# Patient Record
Sex: Male | Born: 1970 | Hispanic: No | Marital: Married | Smoking: Never smoker
Health system: Southern US, Community
[De-identification: ages and names within clinical notes are randomized; demographics above are authoritative.]

## PROBLEM LIST (undated history)

## (undated) DIAGNOSIS — I10 Essential (primary) hypertension: Secondary | ICD-10-CM

## (undated) DIAGNOSIS — F419 Anxiety disorder, unspecified: Secondary | ICD-10-CM

## (undated) DIAGNOSIS — M109 Gout, unspecified: Secondary | ICD-10-CM

## (undated) HISTORY — PX: ANTERIOR CRUCIATE LIGAMENT REPAIR: SHX115

## (undated) HISTORY — PX: APPENDECTOMY: SHX54

---

## 2013-11-01 ENCOUNTER — Encounter: Payer: Self-pay | Admitting: *Deleted

## 2013-11-01 ENCOUNTER — Ambulatory Visit (INDEPENDENT_AMBULATORY_CARE_PROVIDER_SITE_OTHER): Payer: PRIVATE HEALTH INSURANCE | Admitting: Cardiology

## 2013-11-01 ENCOUNTER — Encounter: Payer: Self-pay | Admitting: Cardiology

## 2013-11-01 VITALS — BP 125/85 | HR 63 | Ht 66.0 in | Wt 251.0 lb

## 2013-11-01 DIAGNOSIS — I10 Essential (primary) hypertension: Secondary | ICD-10-CM | POA: Insufficient documentation

## 2013-11-01 DIAGNOSIS — M109 Gout, unspecified: Secondary | ICD-10-CM

## 2013-11-01 NOTE — Progress Notes (Signed)
      [address] [place], [place]  [address] Phone: [phone] Fax:  [phone]  Date:  11/01/2013   ID:  Matthew LaityRicardo Blocher, DOB 02-16-1971, MRN [id number]  PCP:  No primary provider on file.   History of Present Illness: Matthew LaityRicardo Enis is a 43 y.o. male here for the evaluation of hypertension. Recently moved from [place]. Was on medication for HTN in the past in [place]. Wife wanted him to be seen. Stopped medication a few months ago. On HCTZ in the past. Wedding ring tight periodically. Travels with his company quite frequently. [place] is new to [place]. He has a friend here from [place].  No chest pain. No SOB. No prior strokelike symptoms.   Wt Readings from Last 3 Encounters:  11/01/13 251 lb (113.853 kg)     No past medical history on file.  Past Surgical History  Procedure Laterality Date  . Appendectomy    . Anterior cruciate ligament repair      No current outpatient prescriptions on file.   No current facility-administered medications for this visit.    Allergies:   No Known Allergies  Social History:  The patient  reports that he has never smoked. He does not have any smokeless tobacco history on file.   Family History  Problem Relation Age of Onset  . Cancer Father   . Heart attack Maternal Grandfather   . Hyperlipidemia    . Hyperlipidemia Mother     ROS:  Please see the history of present illness.   Denies any fevers, chills, orthopnea, PND, strokelike, chest pain   All other systems reviewed and negative.   PHYSICAL EXAM: VS:  BP 125/85  Pulse 63  Ht 5\' 6"  (1.676 m)  Wt 251 lb (113.853 kg)  BMI 40.53 kg/m2 Well nourished, well developed, in no acute distress HEENT: normal, [place]/AT, EOMI thick neck Neck: no JVD, normal carotid upstroke, no bruit Cardiac:  normal S1, S2; RRR; no murmur Lungs:  clear to auscultation bilaterally, no wheezing, rhonchi or rales Abd: soft, nontender, no hepatomegaly, no bruitsobese Ext: no edema, 2+ distal  pulses Skin: warm and dry GU: deferred Neuro: no focal abnormalities noted, AAO x 3  EKG:  11/01/13-sinus rhythm, 63, no other abnormalities.     ASSESSMENT AND PLAN:  1. HTN - was on diovan HCT 320/12.5, amlodipine 5mg  in [place]. Currently, his blood pressure is within normal range. I've asked him to periodically check this at the pharmacy. At one point, he had a home blood pressure cuff and was getting anxious with this and ended up having to get rid of it. 2. Gout-has allopurinol. He may wish to avoid HCTZ. 3. Obesity-continue to encourage weight loss. Exercise. 4. I offered him a repeat followup however he would like to see me back on as-needed basis. Encouraged him to monitor his blood pressure. Instruct him that this could be a risk for stroke or heart attack if uncontrolled. I would've expected his blood pressure to be higher with the amount of medication that he was previously on.  Signed, Donato SchultzMark Skains, MD [facility]  11/01/2013 3:09 PM

## 2013-11-01 NOTE — Patient Instructions (Signed)
Check Blood Pressure periodically at local drug store if blood pressure greater than 140/90 call and let Dr.Skains know   Need a PCP    Follow up with Dr.Skains as needed

## 2014-10-11 ENCOUNTER — Other Ambulatory Visit (INDEPENDENT_AMBULATORY_CARE_PROVIDER_SITE_OTHER): Payer: Self-pay | Admitting: General Surgery

## 2014-10-11 ENCOUNTER — Other Ambulatory Visit: Payer: Self-pay | Admitting: *Deleted

## 2014-10-11 DIAGNOSIS — R109 Unspecified abdominal pain: Secondary | ICD-10-CM

## 2014-10-11 NOTE — Addendum Note (Signed)
Addended by: Ernestene MentionINGRAM, Loren Sawaya M on: 10/11/2014 10:25 AM   Modules accepted: Orders

## 2014-10-22 ENCOUNTER — Ambulatory Visit
Admission: RE | Admit: 2014-10-22 | Discharge: 2014-10-22 | Disposition: A | Discharge status: Home / Self Care | Payer: PRIVATE HEALTH INSURANCE | Source: Ambulatory Visit | Attending: General Surgery | Admitting: General Surgery

## 2018-12-30 ENCOUNTER — Emergency Department (INDEPENDENT_AMBULATORY_CARE_PROVIDER_SITE_OTHER)
Admission: EM | Admit: 2018-12-30 | Discharge: 2018-12-30 | Disposition: A | Discharge status: Home / Self Care | Payer: PRIVATE HEALTH INSURANCE | Source: Home / Self Care

## 2018-12-30 ENCOUNTER — Other Ambulatory Visit: Payer: Self-pay

## 2018-12-30 DIAGNOSIS — M7989 Other specified soft tissue disorders: Secondary | ICD-10-CM | POA: Diagnosis not present

## 2018-12-30 DIAGNOSIS — M79671 Pain in right foot: Secondary | ICD-10-CM | POA: Diagnosis not present

## 2018-12-30 LAB — POCT CBC W AUTO DIFF (K'VILLE URGENT CARE)

## 2018-12-30 MED ORDER — COLCHICINE 0.6 MG PO TABS: ORAL_TABLET | ORAL | 0 refills | Status: DC

## 2018-12-30 MED ORDER — PREDNISONE 50 MG PO TABS: 50.0000 mg | ORAL_TABLET | Freq: Every day | ORAL | 0 refills | Status: AC

## 2018-12-30 NOTE — ED Provider Notes (Signed)
[facility]    CSN: [id number] Arrival date & time: 12/30/18  0950     History   Chief Complaint Chief Complaint  Patient presents with  . Foot Pain    HPI Matthew George is a 48 y.o. male.   HPI Matthew George is a 48 y.o. male presenting to UC with c/o 3 days of gradually worsening Right foot pain, swelling, and redness.  He has taken ibuprofen with mild improvement in pain.  Hx of similar symptoms last year, per medical records, pt was dx with tendonitis.  He believes he has been tested for gout but has never been dx with gout.  Denies hx of cellulitis. Denies fever, chills, n/v/d. No recent known injury.    History reviewed. No pertinent past medical history.  Patient Active Problem List   Diagnosis Date Noted  . Essential hypertension 11/01/2013  . Morbid obesity ([place]) 11/01/2013  . Gout 11/01/2013    Past Surgical History:  Procedure Laterality Date  . ANTERIOR CRUCIATE LIGAMENT REPAIR    . APPENDECTOMY         Home Medications    Prior to Admission medications   Medication Sig Start Date End Date Taking? Authorizing Provider  FLUoxetine (PROZAC) 10 MG tablet Take by mouth. 10/19/18 10/19/19 Yes [provider]  colchicine 0.6 MG tablet Take 2 tabs (1.2mg ) followed by 1 tab one hour later. May take 1 tab the next 2 days. 12/30/18   Noe Gens, PA-C  predniSONE (DELTASONE) 50 MG tablet Take 1 tablet (50 mg total) by mouth daily with breakfast for 5 days. 12/30/18 01/04/19  Noe Gens, PA-C    Family History Family History  Problem Relation Age of Onset  . Hyperlipidemia Mother   . Cancer Father   . Heart attack Maternal Grandfather   . Hyperlipidemia Other     Social History Social History   Tobacco Use  . Smoking status: Never Smoker  Substance Use Topics  . Alcohol use: Not on file  . Drug use: Not on file     Allergies   Iodine   Review of Systems Review of Systems  Constitutional: Negative for chills and  fever.  Musculoskeletal: Positive for arthralgias and joint swelling.  Skin: Positive for color change. Negative for wound.     Physical Exam Triage Vital Signs ED Triage Vitals  Enc Vitals Group     BP 12/30/18 1009 123/83     Pulse Rate 12/30/18 1009 85     Resp 12/30/18 1009 20     Temp 12/30/18 1009 98.5 F (36.9 C)     Temp Source 12/30/18 1009 Oral     SpO2 12/30/18 1009 95 %     Weight 12/30/18 1010 244 lb (110.7 kg)     Height 12/30/18 1010 5\' 8"  (1.727 m)     Head Circumference --      Peak Flow --      Pain Score 12/30/18 1010 7     Pain Loc --      Pain Edu? --      Excl. in [place]? --    No data found.  Updated Vital Signs BP 123/83 (BP Location: Right Arm)   Pulse 85   Temp 98.5 F (36.9 C) (Oral)   Resp 20   Ht 5\' 8"  (1.727 m)   Wt 244 lb (110.7 kg)   SpO2 95%   BMI 37.10 kg/m   Visual Acuity Right Eye Distance:   Left Eye  Distance:   Bilateral Distance:    Right Eye Near:   Left Eye Near:    Bilateral Near:     Physical Exam Vitals signs and nursing note reviewed.  Constitutional:      Appearance: Normal appearance. He is well-developed.  HENT:     Head: Normocephalic and atraumatic.  Neck:     Musculoskeletal: Normal range of motion.  Cardiovascular:     Rate and Rhythm: Normal rate.  Pulmonary:     Effort: Pulmonary effort is normal.  Musculoskeletal: Normal range of motion.        General: Swelling and tenderness present.     Comments: Right foot: mild to moderate edema compared to Left. Tenderness to dorsal aspect. Full ROM ankle and toes. No tenderness of ankle or toes.  Skin:    General: Skin is warm and dry.     Capillary Refill: Capillary refill takes less than 2 seconds.     Findings: Erythema present.          Comments: Right foot: diffuse erythema and warmth of skin. No induration or fluctuance. Mild tenderness.   Neurological:     Mental Status: He is alert and oriented to person, place, and time.  Psychiatric:         Behavior: Behavior normal.      UC Treatments / Results  Labs (all labs ordered are listed, but only abnormal results are displayed) Labs Reviewed  URIC ACID  POCT CBC W AUTO DIFF ([facility])    EKG   Radiology No results found.  Procedures Procedures (including critical care time)  Medications Ordered in UC Medications - No data to display  Initial Impression / Assessment and Plan / UC Course  I have reviewed the triage vital signs and the nursing notes.  Pertinent labs & imaging results that were available during my care of the patient were reviewed by me and considered in my medical decision making (see chart for details).     Hx and exam most c/w gout. Reviewed PMH. Unable to find any labs c/w testing for gout. Offered to test pt today, pt agreeable. CBC: WNL, doubt cellulitis at this time given pt's hx of similar symptoms. Will start pt on colchicine and prednisone F/u with PCP or sports medicine next week if not improving.  Final Clinical Impressions(s) / UC Diagnoses   Final diagnoses:  Right foot pain  Swelling of right foot     Discharge Instructions      Please take your medication as prescribed and follow up with family medicine or sports medicine next week if not improving.     ED Prescriptions    Medication Sig Dispense Auth. Provider   predniSONE (DELTASONE) 50 MG tablet Take 1 tablet (50 mg total) by mouth daily with breakfast for 5 days. 5 tablet Waylan RocherPhelps, Audri Kozub O, PA-C   colchicine 0.6 MG tablet Take 2 tabs (1.2mg ) followed by 1 tab one hour later. May take 1 tab the next 2 days. 5 tablet Lurene ShadowPhelps, Deshunda Thackston O, PA-C     Controlled Substance Prescriptions [place] Controlled Substance Registry consulted? Not Applicable   Rolla Platehelps, Janeah Kovacich O, PA-C 12/30/18 1211

## 2018-12-30 NOTE — ED Triage Notes (Signed)
Woke up 3 days ago and right foot was red and swollen.  Ibuprofen for discomfort

## 2018-12-30 NOTE — Discharge Instructions (Signed)
°  Please take your medication as prescribed and follow up with family medicine or sports medicine next week if not improving.

## 2018-12-31 ENCOUNTER — Telehealth: Payer: Self-pay | Admitting: Emergency Medicine

## 2018-12-31 LAB — URIC ACID: Uric Acid, Serum: 8.1 mg/dL — ABNORMAL HIGH (ref 4.0–8.0)

## 2019-01-02 NOTE — Telephone Encounter (Signed)
Notified patient of lab results 

## 2019-03-17 ENCOUNTER — Emergency Department (INDEPENDENT_AMBULATORY_CARE_PROVIDER_SITE_OTHER)
Admission: EM | Admit: 2019-03-17 | Discharge: 2019-03-17 | Disposition: A | Discharge status: Home / Self Care | Payer: PRIVATE HEALTH INSURANCE | Source: Home / Self Care

## 2019-03-17 ENCOUNTER — Other Ambulatory Visit: Payer: Self-pay

## 2019-03-17 ENCOUNTER — Encounter: Payer: Self-pay | Admitting: Emergency Medicine

## 2019-03-17 DIAGNOSIS — M109 Gout, unspecified: Secondary | ICD-10-CM | POA: Diagnosis not present

## 2019-03-17 HISTORY — DX: Essential (primary) hypertension: I10

## 2019-03-17 MED ORDER — COLCHICINE 0.6 MG PO TABS: ORAL_TABLET | ORAL | 0 refills | Status: AC

## 2019-03-17 NOTE — ED Triage Notes (Signed)
Patient states Monday his left ankle started hurting in the tendon. States it was better Tuesday then Wednesday night pain worsened. Constant pain, took ibuprofen some relief.

## 2019-03-17 NOTE — ED Provider Notes (Signed)
[name]    CSN: [id number] Arrival date & time: 03/17/19  1324      History   Chief Complaint Chief Complaint  Patient presents with  . Foot Pain    HPI Matthew George is a 48 y.o. male.   Cyndi Bender patient  Patient states Monday his left ankle started hurting in the tendon. States it was better Tuesday then Wednesday night pain worsened. Constant pain, took ibuprofen some relief.   Patient had a similar problem in his right foot several months ago and was treated with colchicine successfully.  He has taken ibuprofen but it had very little effect on this discomfort.  Patient has a known problem with gout with a borderline uric acid.     Past Medical History:  Diagnosis Date  . Hypertension     Patient Active Problem List   Diagnosis Date Noted  . Essential hypertension 11/01/2013  . Morbid obesity ([facility]) 11/01/2013  . Gout 11/01/2013    Past Surgical History:  Procedure Laterality Date  . ANTERIOR CRUCIATE LIGAMENT REPAIR    . APPENDECTOMY         Home Medications    Prior to Admission medications   Medication Sig Start Date End Date Taking? Authorizing Provider  colchicine 0.6 MG tablet Take 2 tabs (1.2mg ) followed by 1 tab one hour later. May take 1 tab the next 2 days. 03/17/19   Elvina Sidle, MD  FLUoxetine (PROZAC) 10 MG tablet Take by mouth. 10/19/18 10/19/19  [provider]    Family History Family History  Problem Relation Age of Onset  . Hyperlipidemia Mother   . Cancer Father   . Heart attack Maternal Grandfather   . Hyperlipidemia Other     Social History Social History   Tobacco Use  . Smoking status: Never Smoker  . Smokeless tobacco: Never Used  Substance Use Topics  . Alcohol use: Yes    Alcohol/week: 3.0 standard drinks    Types: 3 Glasses of wine per week  . Drug use: Not on file     Allergies   Iodine   Review of Systems Review of Systems  All other systems reviewed and are  negative.    Physical Exam Triage Vital Signs ED Triage Vitals  Enc Vitals Group     BP 03/17/19 1352 (!) 145/83     Pulse Rate 03/17/19 1352 78     Resp --      Temp 03/17/19 1352 98.1 F (36.7 C)     Temp Source 03/17/19 1352 Oral     SpO2 03/17/19 1352 95 %     Weight 03/17/19 1353 247 lb (112 kg)     Height --      Head Circumference --      Peak Flow --      Pain Score 03/17/19 1353 5     Pain Loc --      Pain Edu? --      Excl. in [place]? --    No data found.  Updated Vital Signs BP (!) 145/83 (BP Location: Right Arm)   Pulse 78   Temp 98.1 F (36.7 C) (Oral)   Wt 112 kg   SpO2 95%   BMI 37.56 kg/m    Physical Exam Vitals signs and nursing note reviewed.  Constitutional:      General: He is not in acute distress.    Appearance: Normal appearance. He is obese.  HENT:     Head: Normocephalic.  Eyes:  Conjunctiva/sclera: Conjunctivae normal.  Neck:     Musculoskeletal: Normal range of motion and neck supple.  Pulmonary:     Effort: Pulmonary effort is normal.  Musculoskeletal:     Comments: Examination of the left ankle reveals mild swelling around the lateral malleolus.  There is no point tenderness.  Patient indicates that he has soreness just posterior to the distal fibula.  Pulses are normal and there is no swelling on the medial side of the ankle or foot.  There is no erythema.  Skin:    General: Skin is warm and dry.     Findings: No bruising or erythema.  Neurological:     General: No focal deficit present.     Mental Status: He is alert.  Psychiatric:        Mood and Affect: Mood normal.      UC Treatments / Results  Labs (all labs ordered are listed, but only abnormal results are displayed) Labs Reviewed - No data to display  EKG   Radiology No results found.  Procedures Procedures (including critical care time)  Medications Ordered in UC Medications - No data to display  Initial Impression / Assessment and Plan / UC Course   I have reviewed the triage vital signs and the nursing notes.  Pertinent labs & imaging results that were available during my care of the patient were reviewed by me and considered in my medical decision making (see chart for details).     Final Clinical Impressions(s) / UC Diagnoses   Final diagnoses:  Acute gout of left ankle, unspecified cause   Discharge Instructions   None    ED Prescriptions    Medication Sig Dispense Auth. Provider   colchicine 0.6 MG tablet Take 2 tabs (1.2mg ) followed by 1 tab one hour later. May take 1 tab the next 2 days. 10 tablet Robyn Haber, MD     I have reviewed the PDMP during this encounter.   Robyn Haber, MD 03/17/19 1419

## 2019-04-07 ENCOUNTER — Other Ambulatory Visit: Payer: Self-pay

## 2019-04-07 ENCOUNTER — Encounter: Payer: Self-pay | Admitting: Emergency Medicine

## 2019-04-07 ENCOUNTER — Emergency Department (INDEPENDENT_AMBULATORY_CARE_PROVIDER_SITE_OTHER)
Admission: EM | Admit: 2019-04-07 | Discharge: 2019-04-07 | Disposition: A | Discharge status: Home / Self Care | Payer: PRIVATE HEALTH INSURANCE | Source: Home / Self Care

## 2019-04-07 DIAGNOSIS — H6012 Cellulitis of left external ear: Secondary | ICD-10-CM

## 2019-04-07 DIAGNOSIS — H60392 Other infective otitis externa, left ear: Secondary | ICD-10-CM | POA: Diagnosis not present

## 2019-04-07 MED ORDER — NEOMYCIN-POLYMYXIN-HC 3.5-10000-1 OT SUSP: 4.0000 [drp] | Freq: Four times a day (QID) | OTIC | 0 refills | Status: AC

## 2019-04-07 MED ORDER — AMOXICILLIN-POT CLAVULANATE 875-125 MG PO TABS: 1.0000 | ORAL_TABLET | Freq: Two times a day (BID) | ORAL | 0 refills | Status: DC

## 2019-04-07 MED ORDER — HYDROCODONE-ACETAMINOPHEN 5-325 MG PO TABS: 1.0000 | ORAL_TABLET | Freq: Four times a day (QID) | ORAL | 0 refills | Status: DC | PRN

## 2019-04-07 NOTE — Discharge Instructions (Addendum)
°  Please take antibiotics as prescribed and be sure to complete entire course even if you start to feel better to ensure infection does not come back.  Norco/Vicodin (hydrocodone-acetaminophen) is a narcotic pain medication, do not combine these medications with others containing tylenol. While taking, do not drink alcohol, drive, or perform any other activities that requires focus while taking these medications.   You may take 500mg  acetaminophen every 4-6 hours or in combination with ibuprofen 400-600mg  every 6-8 hours as needed for pain, inflammation, and fever.  Be sure to well hydrated with clear liquids and get at least 8 hours of sleep at night, preferably more while sick.   Please follow up with family medicine in 1 week if needed.

## 2019-04-07 NOTE — ED Provider Notes (Signed)
[name]    CSN: [id number] Arrival date & time: 04/07/19  7106      History   Chief Complaint Chief Complaint  Patient presents with  . Otalgia    HPI Matthew George is a 48 y.o. male.   HPI  Matthew George is a 48 y.o. male presenting to UC with c/o 2 days of gradually worsening Left ear pain, swelling, and warmth.  Pain is throbbing, 9/10, worse when lying on his Left side. Hx of dried skin around his ears, which causes infection at times. Pt notes it is usually his Right ear but today it is his Left ear.  He has been taking up to 4 ibuprofen every 3 hours and using OTC homeopathic ear drops w/o relief. Pain wakes him from his sleep. Denies dizziness but has a mild headache at times.  Denies fever, chills, cough, congestion, n/v/d.    Past Medical History:  Diagnosis Date  . Hypertension     Patient Active Problem List   Diagnosis Date Noted  . Essential hypertension 11/01/2013  . Morbid obesity ([facility]) 11/01/2013  . Gout 11/01/2013    Past Surgical History:  Procedure Laterality Date  . ANTERIOR CRUCIATE LIGAMENT REPAIR    . APPENDECTOMY         Home Medications    Prior to Admission medications   Medication Sig Start Date End Date Taking? Authorizing Provider  amoxicillin-clavulanate (AUGMENTIN) 875-125 MG tablet Take 1 tablet by mouth 2 (two) times daily. One po bid x 7 days 04/07/19   Lurene Shadow, PA-C  colchicine 0.6 MG tablet Take 2 tabs (1.2mg ) followed by 1 tab one hour later. May take 1 tab the next 2 days. 03/17/19   Elvina Sidle, MD  FLUoxetine (PROZAC) 10 MG tablet Take by mouth. 10/19/18 10/19/19  [provider]  HYDROcodone-acetaminophen (NORCO/VICODIN) 5-325 MG tablet Take 1 tablet by mouth every 6 (six) hours as needed. 04/07/19   Lurene Shadow, PA-C  neomycin-polymyxin-hydrocortisone (CORTISPORIN) 3.5-10000-1 OTIC suspension Place 4 drops into the left ear 4 (four) times daily for 7 days. X 7 days 04/07/19 04/14/19   Lurene Shadow, PA-C    Family History Family History  Problem Relation Age of Onset  . Hyperlipidemia Mother   . Cancer Father   . Heart attack Maternal Grandfather   . Hyperlipidemia Other     Social History Social History   Tobacco Use  . Smoking status: Never Smoker  . Smokeless tobacco: Never Used  Substance Use Topics  . Alcohol use: Yes    Alcohol/week: 3.0 standard drinks    Types: 3 Glasses of wine per week  . Drug use: Not on file     Allergies   Iodine   Review of Systems Review of Systems  Constitutional: Negative for chills and fever.  HENT: Positive for ear discharge, ear pain and facial swelling. Negative for congestion, sore throat, trouble swallowing and voice change.        Left ear  Respiratory: Negative for cough and shortness of breath.   Cardiovascular: Negative for chest pain and palpitations.  Gastrointestinal: Negative for abdominal pain, diarrhea, nausea and vomiting.  Musculoskeletal: Negative for arthralgias, back pain and myalgias.  Skin: Negative for rash.  Neurological: Positive for headaches (mild). Negative for dizziness and light-headedness.     Physical Exam Triage Vital Signs ED Triage Vitals  Enc Vitals Group     BP 04/07/19 0959 122/81     Pulse Rate 04/07/19 0959 81  Resp --      Temp 04/07/19 0959 (!) 97.4 F (36.3 C)     Temp Source 04/07/19 0959 Oral     SpO2 04/07/19 0959 96 %     Weight 04/07/19 1000 246 lb (111.6 kg)     Height 04/07/19 1000 5\' 8"  (1.727 m)     Head Circumference --      Peak Flow --      Pain Score 04/07/19 1000 9     Pain Loc --      Pain Edu? --      Excl. in [place]? --    No data found.  Updated Vital Signs BP 122/81 (BP Location: Right Arm)   Pulse 81   Temp (!) 97.4 F (36.3 C) (Oral)   Ht 5\' 8"  (1.727 m)   Wt 246 lb (111.6 kg)   SpO2 96%   BMI 37.40 kg/m   Visual Acuity Right Eye Distance:   Left Eye Distance:   Bilateral Distance:    Right Eye Near:   Left Eye Near:     Bilateral Near:     Physical Exam Vitals signs and nursing note reviewed.  Constitutional:      Appearance: Normal appearance. He is well-developed.  HENT:     Head: Normocephalic and atraumatic.     Right Ear: Tympanic membrane normal.     Left Ear: Drainage (scant, clear), swelling and tenderness present. No mastoid tenderness. Tympanic membrane is erythematous. Tympanic membrane is not perforated or bulging.     Ears:      Nose: Nose normal.     Mouth/Throat:     Mouth: Mucous membranes are moist.  Neck:     Musculoskeletal: Normal range of motion.  Cardiovascular:     Rate and Rhythm: Normal rate and regular rhythm.  Pulmonary:     Effort: Pulmonary effort is normal. No respiratory distress.     Breath sounds: Normal breath sounds. No stridor. No wheezing, rhonchi or rales.  Musculoskeletal: Normal range of motion.  Skin:    General: Skin is warm and dry.  Neurological:     Mental Status: He is alert and oriented to person, place, and time.  Psychiatric:        Behavior: Behavior normal.      UC Treatments / Results  Labs (all labs ordered are listed, but only abnormal results are displayed) Labs Reviewed - No data to display  EKG   Radiology No results found.  Procedures Procedures (including critical care time)  Medications Ordered in UC Medications - No data to display  Initial Impression / Assessment and Plan / UC Course  I have reviewed the triage vital signs and the nursing notes.  Pertinent labs & imaging results that were available during my care of the patient were reviewed by me and considered in my medical decision making (see chart for details).     Hx and exam c/w otitis externa and early cellulitis of left external ear Will tx with otic and PO antibiotics AVS provided  Final Clinical Impressions(s) / UC Diagnoses   Final diagnoses:  Otitis, externa, infective, left  Cellulitis of left external ear     Discharge Instructions       Please take antibiotics as prescribed and be sure to complete entire course even if you start to feel better to ensure infection does not come back.  Norco/Vicodin (hydrocodone-acetaminophen) is a narcotic pain medication, do not combine these medications with others containing tylenol. While  taking, do not drink alcohol, drive, or perform any other activities that requires focus while taking these medications.   You may take 500mg  acetaminophen every 4-6 hours or in combination with ibuprofen 400-600mg  every 6-8 hours as needed for pain, inflammation, and fever.  Be sure to well hydrated with clear liquids and get at least 8 hours of sleep at night, preferably more while sick.   Please follow up with family medicine in 1 week if needed.      ED Prescriptions    Medication Sig Dispense Auth. Provider   amoxicillin-clavulanate (AUGMENTIN) 875-125 MG tablet Take 1 tablet by mouth 2 (two) times daily. One po bid x 7 days 14 tablet Melania Kirks O, PA-C   neomycin-polymyxin-hydrocortisone (CORTISPORIN) 3.5-10000-1 OTIC suspension Place 4 drops into the left ear 4 (four) times daily for 7 days. X 7 days 5.6 mL Gerarda Fraction, Cristalle Rohm O, PA-C   HYDROcodone-acetaminophen (NORCO/VICODIN) 5-325 MG tablet Take 1 tablet by mouth every 6 (six) hours as needed. 8 tablet Noe Gens, [place]     I have reviewed the PDMP during this encounter.   Noe Gens, PA-C 04/07/19 1108

## 2019-04-07 NOTE — ED Triage Notes (Signed)
Lt ear pain x 2 days 

## 2020-01-11 ENCOUNTER — Emergency Department (INDEPENDENT_AMBULATORY_CARE_PROVIDER_SITE_OTHER)
Admission: RE | Admit: 2020-01-11 | Discharge: 2020-01-11 | Disposition: A | Discharge status: Home / Self Care | Payer: PRIVATE HEALTH INSURANCE | Source: Ambulatory Visit

## 2020-01-11 ENCOUNTER — Other Ambulatory Visit: Payer: Self-pay

## 2020-01-11 VITALS — BP 138/94 | HR 70 | Temp 98.4°F | Resp 18

## 2020-01-11 DIAGNOSIS — H6691 Otitis media, unspecified, right ear: Secondary | ICD-10-CM | POA: Diagnosis not present

## 2020-01-11 DIAGNOSIS — H60501 Unspecified acute noninfective otitis externa, right ear: Secondary | ICD-10-CM

## 2020-01-11 MED ORDER — NEOMYCIN-POLYMYXIN-HC 3.5-10000-1 OT SUSP: 4.0000 [drp] | Freq: Three times a day (TID) | OTIC | 0 refills | Status: AC

## 2020-01-11 MED ORDER — AMOXICILLIN-POT CLAVULANATE 875-125 MG PO TABS: 1.0000 | ORAL_TABLET | Freq: Two times a day (BID) | ORAL | 0 refills | Status: DC

## 2020-01-11 NOTE — Discharge Instructions (Signed)
  Please take antibiotics as prescribed and be sure to complete entire course even if you start to feel better to ensure infection does not come back.  You may take 500mg  acetaminophen every 4-6 hours or in combination with ibuprofen 400-600mg  every 6-8 hours as needed for pain, inflammation, and fever.  Be sure to well hydrated with clear liquids and get at least 8 hours of sleep at night, preferably more while sick.   Please follow up with family medicine in 1 week if needed.  Call to schedule a follow up appointment with Ear Nose and Throat specialist for further evaluation and treatment of recurrent ear infections.

## 2020-01-11 NOTE — ED Provider Notes (Signed)
[name]    CSN: [id number] Arrival date & time: 01/11/20  1055      History   Chief Complaint Chief Complaint  Patient presents with  . Otalgia    RT    HPI Matthew George is a 49 y.George. male.   HPI Matthew George is a 49 y.George. male presenting to UC with c/George 1 week of Right ear fullness and sensation of fluid in his ear. He reports small amounts of clear and blood tinged discharge the last 2 days. No recent swimming but he has a hx of recurrent ear infections in same ear. Denies pain at this time. No HA or dizziness. Pt is scheduled to travel next week and does not want his symptoms to get worsen. Denies fever, chills, n/v/d.    Past Medical History:  Diagnosis Date  . Hypertension     Patient Active Problem List   Diagnosis Date Noted  . Essential hypertension 11/01/2013  . Morbid obesity (HCC) 11/01/2013  . Gout 11/01/2013    Past Surgical History:  Procedure Laterality Date  . ANTERIOR CRUCIATE LIGAMENT REPAIR    . APPENDECTOMY         Home Medications    Prior to Admission medications   Medication Sig Start Date End Date Taking? Authorizing Provider  allopurinol (ZYLOPRIM) 300 MG tablet Take 300 mg by mouth daily. 12/23/19   [provider]  amoxicillin-clavulanate (AUGMENTIN) 875-125 MG tablet Take 1 tablet by mouth 2 (two) times daily. One po bid x 7 days 01/11/20   Lurene Shadow, PA-C  clobetasol ointment (TEMOVATE) 0.05 % Apply topically.    [provider]  colchicine 0.6 MG tablet Take 2 tabs (1.2mg ) followed by 1 tab one hour later. May take 1 tab the next 2 days. 03/17/19   Elvina Sidle, MD  FLUoxetine (PROZAC) 10 MG tablet Take by mouth. 10/19/18 10/19/19  [provider]  HYDROcodone-acetaminophen (NORCO/VICODIN) 5-325 MG tablet Take 1 tablet by mouth every 6 (six) hours as needed. 04/07/19   Lurene Shadow, PA-C  neomycin-polymyxin-hydrocortisone (CORTISPORIN) 3.5-10000-1 OTIC suspension Place 4 drops into  the right ear 3 (three) times daily for 7 days. 01/11/20 01/18/20  Lurene Shadow, PA-C    Family History Family History  Problem Relation Age of Onset  . Hyperlipidemia Mother   . Cancer Father   . Heart attack Maternal Grandfather   . Hyperlipidemia Other     Social History Social History   Tobacco Use  . Smoking status: Never Smoker  . Smokeless tobacco: Never Used  Vaping Use  . Vaping Use: Never used  Substance Use Topics  . Alcohol use: Yes    Alcohol/week: 3.0 standard drinks    Types: 3 Glasses of wine per week  . Drug use: Not on file     Allergies   Iodine   Review of Systems Review of Systems  Constitutional: Negative for chills and fever.  HENT: Positive for ear discharge and ear pain. Negative for congestion, sore throat, trouble swallowing and voice change.   Respiratory: Negative for cough and shortness of breath.   Cardiovascular: Negative for chest pain and palpitations.  Gastrointestinal: Negative for abdominal pain, diarrhea, nausea and vomiting.  Musculoskeletal: Negative for arthralgias, back pain and myalgias.  Skin: Negative for rash.  Neurological: Negative for dizziness and headaches.  All other systems reviewed and are negative.    Physical Exam Triage Vital Signs ED Triage Vitals  Enc Vitals Group  BP 01/11/20 1104 (!) 138/94     Pulse Rate 01/11/20 1104 70     Resp 01/11/20 1104 18     Temp 01/11/20 1104 98.4 F (36.9 C)     Temp Source 01/11/20 1104 Oral     SpO2 01/11/20 1104 94 %     Weight --      Height --      Head Circumference --      Peak Flow --      Pain Score 01/11/20 1106 0     Pain Loc --      Pain Edu? --      Excl. in GC? --    No data found.  Updated Vital Signs BP (!) 138/94 (BP Location: Right Arm)   Pulse 70   Temp 98.4 F (36.9 C) (Oral)   Resp 18   SpO2 94%   Visual Acuity Right Eye Distance:   Left Eye Distance:   Bilateral Distance:    Right Eye Near:   Left Eye Near:    Bilateral  Near:     Physical Exam Vitals and nursing note reviewed.  Constitutional:      General: He is not in acute distress.    Appearance: Normal appearance. He is well-developed. He is not ill-appearing, toxic-appearing or diaphoretic.  HENT:     Head: Normocephalic and atraumatic.     Right Ear: Ear canal normal. Drainage ( scant, white) and swelling ( moderate with erythema) present. No tenderness. Tympanic membrane is erythematous and bulging.     Left Ear: Tympanic membrane and ear canal normal.     Nose: Nose normal.     Right Sinus: No maxillary sinus tenderness or frontal sinus tenderness.     Left Sinus: No maxillary sinus tenderness or frontal sinus tenderness.     Mouth/Throat:     Lips: Pink.     Mouth: Mucous membranes are moist.     Pharynx: Oropharynx is clear. Uvula midline.  Cardiovascular:     Rate and Rhythm: Normal rate and regular rhythm.  Pulmonary:     Effort: Pulmonary effort is normal. No respiratory distress.     Breath sounds: Normal breath sounds. No stridor. No wheezing, rhonchi or rales.  Musculoskeletal:        General: Normal range of motion.     Cervical back: Normal range of motion.  Skin:    General: Skin is warm and dry.  Neurological:     Mental Status: He is alert and oriented to person, place, and time.  Psychiatric:        Behavior: Behavior normal.      UC Treatments / Results  Labs (all labs ordered are listed, but only abnormal results are displayed) Labs Reviewed - No data to display  EKG   Radiology No results found.  Procedures Procedures (including critical care time)  Medications Ordered in UC Medications - No data to display  Initial Impression / Assessment and Plan / UC Course  I have reviewed the triage vital signs and the nursing notes.  Pertinent labs & imaging results that were available during my care of the patient were reviewed by me and considered in my medical decision making (see chart for details).      Hx and exam c/w AOM and otitis externa of Right ear Will tx with oral and topical antibiotics Pt has been to [facility] before for similar infections, encouraged f/u with ENT AVS provided  Final Clinical Impressions(s) / UC  Diagnoses   Final diagnoses:  Right acute otitis media  Acute otitis externa of right ear, unspecified type     Discharge Instructions      Please take antibiotics as prescribed and be sure to complete entire course even if you start to feel better to ensure infection does not come back.  You may take 500mg  acetaminophen every 4-6 hours or in combination with ibuprofen 400-600mg  every 6-8 hours as needed for pain, inflammation, and fever.  Be sure to well hydrated with clear liquids and get at least 8 hours of sleep at night, preferably more while sick.   Please follow up with family medicine in 1 week if needed.  Call to schedule a follow up appointment with Ear Nose and Throat specialist for further evaluation and treatment of recurrent ear infections.     ED Prescriptions    Medication Sig Dispense Auth. Provider   amoxicillin-clavulanate (AUGMENTIN) 875-125 MG tablet Take 1 tablet by mouth 2 (two) times daily. One po bid x 7 days 14 tablet Matthew Leventhal O, PA-C   neomycin-polymyxin-hydrocortisone (CORTISPORIN) 3.5-10000-1 OTIC suspension Place 4 drops into the right ear 3 (three) times daily for 7 days. 10 mL 02-15-2003, PA-C     PDMP not reviewed this encounter.   Lurene Shadow, Lurene Shadow 01/11/20 1122

## 2020-01-11 NOTE — ED Triage Notes (Signed)
Pt c/o RT ear pain x 1 week. Says he felt like it was draining and also noticed some blood. Says it constantly feels like it has water in his ear. Hx of ear infections.

## 2020-01-19 ENCOUNTER — Emergency Department (INDEPENDENT_AMBULATORY_CARE_PROVIDER_SITE_OTHER)
Admission: EM | Admit: 2020-01-19 | Discharge: 2020-01-19 | Disposition: A | Discharge status: Home / Self Care | Payer: PRIVATE HEALTH INSURANCE | Source: Home / Self Care

## 2020-01-19 ENCOUNTER — Other Ambulatory Visit: Payer: Self-pay

## 2020-01-19 DIAGNOSIS — L03114 Cellulitis of left upper limb: Secondary | ICD-10-CM

## 2020-01-19 MED ORDER — DOXYCYCLINE HYCLATE 100 MG PO TABS: 100.0000 mg | ORAL_TABLET | Freq: Two times a day (BID) | ORAL | 0 refills | Status: DC

## 2020-01-19 NOTE — ED Triage Notes (Signed)
Pt c/o rash on LT upper arm and neck. Pt is currently staying in hotel. Noticed Wed night. Itchy and swollen, using itch cream and generic benedryl prn.

## 2020-01-19 NOTE — ED Provider Notes (Signed)
[facility]    CSN: [id number] Arrival date & time: 01/19/20  1419      History   Chief Complaint Chief Complaint  Patient presents with  . Rash   Pt reports he has redness and swelling to his left elbow patient reports he has red swollen areas to the back of his neck patient states that he is unsure if he was bitten by something while he was staying at a motel or if areas are red and inflamed because of chemical exposure from the bedding. She reports increased redness to his left elbow if redness is increasing HPI Matthew George is a 48 y.o. male.    Past Medical History:  Diagnosis Date  . Hypertension     Patient Active Problem List   Diagnosis Date Noted  . Essential hypertension 11/01/2013  . Morbid obesity (HCC) 11/01/2013  . Gout 11/01/2013    Past Surgical History:  Procedure Laterality Date  . ANTERIOR CRUCIATE LIGAMENT REPAIR    . APPENDECTOMY         Home Medications    Prior to Admission medications   Medication Sig Start Date End Date Taking? Authorizing Provider  allopurinol (ZYLOPRIM) 300 MG tablet Take 300 mg by mouth daily. 12/23/19   [provider]  amoxicillin-clavulanate (AUGMENTIN) 875-125 MG tablet Take 1 tablet by mouth 2 (two) times daily. One po bid x 7 days 01/11/20   Lurene Shadow, PA-C  clobetasol ointment (TEMOVATE) 0.05 % Apply topically.    [provider]  colchicine 0.6 MG tablet Take 2 tabs (1.2mg ) followed by 1 tab one hour later. May take 1 tab the next 2 days. 03/17/19   Elvina Sidle, MD  doxycycline (VIBRA-TABS) 100 MG tablet Take 1 tablet (100 mg total) by mouth 2 (two) times daily. 01/19/20   Elson Areas, PA-C  FLUoxetine (PROZAC) 10 MG tablet Take by mouth. 10/19/18 10/19/19  [provider]  HYDROcodone-acetaminophen (NORCO/VICODIN) 5-325 MG tablet Take 1 tablet by mouth every 6 (six) hours as needed. 04/07/19   Lurene Shadow, PA-C    Family History Family History  Problem  Relation Age of Onset  . Hyperlipidemia Mother   . Cancer Father   . Heart attack Maternal Grandfather   . Hyperlipidemia Other     Social History Social History   Tobacco Use  . Smoking status: Never Smoker  . Smokeless tobacco: Never Used  Vaping Use  . Vaping Use: Never used  Substance Use Topics  . Alcohol use: Yes    Alcohol/week: 3.0 standard drinks    Types: 3 Glasses of wine per week  . Drug use: Not on file     Allergies   Iodine   Review of Systems Review of Systems  Skin: Positive for rash.  All other systems reviewed and are negative.    Physical Exam Triage Vital Signs ED Triage Vitals  Enc Vitals Group     BP 01/19/20 1422 (!) 134/85     Pulse Rate 01/19/20 1422 91     Resp 01/19/20 1422 18     Temp 01/19/20 1422 98.2 F (36.8 C)     Temp Source 01/19/20 1422 Oral     SpO2 01/19/20 1422 96 %     Weight --      Height --      Head Circumference --      Peak Flow --      Pain Score 01/19/20 1425 1     Pain  Loc --      Pain Edu? --      Excl. in GC? --    No data found.  Updated Vital Signs BP (!) 134/85 (BP Location: Right Arm)   Pulse 91   Temp 98.2 F (36.8 C) (Oral)   Resp 18   SpO2 96%   Visual Acuity Right Eye Distance:   Left Eye Distance:   Bilateral Distance:    Right Eye Near:   Left Eye Near:    Bilateral Near:     Physical Exam Vitals and nursing note reviewed.  Constitutional:      Appearance: Normal appearance. He is well-developed.  HENT:     Head: Normocephalic and atraumatic.  Eyes:     Conjunctiva/sclera: Conjunctivae normal.  Cardiovascular:     Rate and Rhythm: Normal rate and regular rhythm.     Heart sounds: No murmur heard.   Pulmonary:     Effort: Pulmonary effort is normal. No respiratory distress.  Abdominal:     Tenderness: There is no abdominal tenderness.  Musculoskeletal:     Cervical back: Neck supple.  Skin:    General: Skin is warm.     Comments: Left elbow is red swollen tender  small bumps mid elbow 2 erythematous areas posterior neck with honeycomb crusting  Neurological:     Mental Status: He is alert.      UC Treatments / Results  Labs (all labs ordered are listed, but only abnormal results are displayed) Labs Reviewed - No data to display  EKG   Radiology No results found.  Procedures Procedures (including critical care time)  Medications Ordered in UC Medications - No data to display  Initial Impression / Assessment and Plan / UC Course  I have reviewed the triage vital signs and the nursing notes.  Pertinent labs & imaging results that were available during my care of the patient were reviewed by me and considered in my medical decision making (see chart for details).     MDM: Patient has just finished Rx for an ear infection. Area of redness may be allergic reaction or possible local reaction however I am most concerned about cellulitis. Patient is advised to soak area will cover with antibiotics patient is advised to recheck in the next 2 days if not improving Final Clinical Impressions(s) / UC Diagnoses   Final diagnoses:  Cellulitis of left elbow   Discharge Instructions   None    ED Prescriptions    Medication Sig Dispense Auth. Provider   doxycycline (VIBRA-TABS) 100 MG tablet Take 1 tablet (100 mg total) by mouth 2 (two) times daily. 20 tablet Elson Areas, [place]     PDMP not reviewed this encounter.  An After Visit Summary was printed and given to the patient.    Elson Areas, [place] 01/19/20 1626

## 2020-02-15 ENCOUNTER — Emergency Department: Admit: 2020-02-15 | Discharge status: Absent without leave | Payer: Self-pay

## 2020-02-16 ENCOUNTER — Emergency Department (INDEPENDENT_AMBULATORY_CARE_PROVIDER_SITE_OTHER): Payer: PRIVATE HEALTH INSURANCE

## 2020-02-16 ENCOUNTER — Emergency Department
Admission: EM | Admit: 2020-02-16 | Discharge: 2020-02-16 | Disposition: A | Discharge status: Home / Self Care | Payer: PRIVATE HEALTH INSURANCE | Source: Home / Self Care

## 2020-02-16 ENCOUNTER — Emergency Department: Admit: 2020-02-16 | Discharge status: Absent without leave | Payer: Self-pay

## 2020-02-16 ENCOUNTER — Other Ambulatory Visit: Payer: Self-pay

## 2020-02-16 DIAGNOSIS — N23 Unspecified renal colic: Secondary | ICD-10-CM

## 2020-02-16 DIAGNOSIS — R109 Unspecified abdominal pain: Secondary | ICD-10-CM

## 2020-02-16 DIAGNOSIS — N281 Cyst of kidney, acquired: Secondary | ICD-10-CM

## 2020-02-16 DIAGNOSIS — R310 Gross hematuria: Secondary | ICD-10-CM

## 2020-02-16 DIAGNOSIS — N200 Calculus of kidney: Secondary | ICD-10-CM

## 2020-02-16 DIAGNOSIS — K760 Fatty (change of) liver, not elsewhere classified: Secondary | ICD-10-CM

## 2020-02-16 LAB — POCT CBC W AUTO DIFF (K'VILLE URGENT CARE)

## 2020-02-16 NOTE — ED Triage Notes (Signed)
Pt c/o pain from kidney stones. Was seen by PCP yesterday. Given Tamsulosin and pain meds and urine checked. Here today in hopes to get an [place] to  Make sure theres no stone blockages

## 2020-02-16 NOTE — Discharge Instructions (Addendum)
  You have stones in both kidneys as well as one in your bladder, which is likely the cause of your recent pain.   Because you have multiple recurrent stones, it is recommended you establish care with a urologist for further evaluation and treatment to help prevent stones in the future.   In the meantime, stay well hydrated so your urine is pale yellow to clear in color.    You were also found to have a complex cyst (mass) on your Left kidney. It is advised that you follow up with your family doctor or a urologist for further evaluation with an MRI or CT soon to determine if additional testing such as a biopsy and removal is indicated to rule out cancer.   Call 911 or have someone drive you to the hospital if symptoms significantly worsening.

## 2020-02-16 NOTE — ED Provider Notes (Signed)
[name]    CSN: [id number] Arrival date & time: 02/16/20  0825      History   Chief Complaint Chief Complaint  Patient presents with  . Nephrolithiasis    HPI Matthew George is a 49 y.o. male.   HPI  Matthew George is a 49 y.o. male presenting to UC with c/o severe left flank pain that started yesterday, associated nausea and vomiting. He saw his PCP who prescribed Flomax, pain has eased off but pt has a hx of kidney stones and wants to make sure none are causing a blockage as he has a business trip coming up.  Denies fever, chills, n/v/d. He has not needed intervention in the past for his stones and does not have a urologist.    Past Medical History:  Diagnosis Date  . Hypertension     Patient Active Problem List   Diagnosis Date Noted  . Essential hypertension 11/01/2013  . Morbid obesity (HCC) 11/01/2013  . Gout 11/01/2013    Past Surgical History:  Procedure Laterality Date  . ANTERIOR CRUCIATE LIGAMENT REPAIR    . APPENDECTOMY         Home Medications    Prior to Admission medications   Medication Sig Start Date End Date Taking? Authorizing Provider  tamsulosin (FLOMAX) 0.4 MG CAPS capsule Take by mouth. 02/15/20  Yes [provider]  allopurinol (ZYLOPRIM) 300 MG tablet Take 300 mg by mouth daily. 12/23/19   [provider]  amoxicillin-clavulanate (AUGMENTIN) 875-125 MG tablet Take 1 tablet by mouth 2 (two) times daily. One po bid x 7 days 01/11/20   Lurene Shadow, PA-C  clobetasol ointment (TEMOVATE) 0.05 % Apply topically.    [provider]  colchicine 0.6 MG tablet Take 2 tabs (1.2mg ) followed by 1 tab one hour later. May take 1 tab the next 2 days. 03/17/19   Elvina Sidle, MD  doxycycline (VIBRA-TABS) 100 MG tablet Take 1 tablet (100 mg total) by mouth 2 (two) times daily. 01/19/20   Elson Areas, PA-C  FLUoxetine (PROZAC) 10 MG tablet Take by mouth. 10/19/18 10/19/19  [provider]    HYDROcodone-acetaminophen (NORCO/VICODIN) 5-325 MG tablet Take 1 tablet by mouth every 6 (six) hours as needed. 04/07/19   Lurene Shadow, PA-C    Family History Family History  Problem Relation Age of Onset  . Hyperlipidemia Mother   . Cancer Father   . Heart attack Maternal Grandfather   . Hyperlipidemia Other     Social History Social History   Tobacco Use  . Smoking status: Never Smoker  . Smokeless tobacco: Never Used  Vaping Use  . Vaping Use: Never used  Substance Use Topics  . Alcohol use: Yes    Alcohol/week: 3.0 standard drinks    Types: 3 Glasses of wine per week  . Drug use: Not on file     Allergies   Iodine   Review of Systems Review of Systems  Gastrointestinal: Negative for abdominal pain, diarrhea, nausea and vomiting.  Genitourinary: Positive for flank pain and hematuria. Negative for decreased urine volume, dysuria, frequency and urgency.     Physical Exam Triage Vital Signs ED Triage Vitals [02/16/20 0841]  Enc Vitals Group     BP 121/73     Pulse Rate 86     Resp 18     Temp 98 F (36.7 C)     Temp Source Oral     SpO2 95 %     Weight  Height      Head Circumference      Peak Flow      Pain Score 0     Pain Loc      Pain Edu?      Excl. in GC?    No data found.  Updated Vital Signs BP 121/73 (BP Location: Right Arm)   Pulse 86   Temp 98 F (36.7 C) (Oral)   Resp 18   SpO2 95%   Visual Acuity Right Eye Distance:   Left Eye Distance:   Bilateral Distance:    Right Eye Near:   Left Eye Near:    Bilateral Near:     Physical Exam Vitals and nursing note reviewed.  Constitutional:      Appearance: Normal appearance. He is well-developed.  HENT:     Head: Normocephalic and atraumatic.     Nose: Nose normal.     Mouth/Throat:     Mouth: Mucous membranes are moist.     Pharynx: Oropharynx is clear.  Cardiovascular:     Rate and Rhythm: Normal rate and regular rhythm.  Pulmonary:     Effort: Pulmonary effort  is normal. No respiratory distress.     Breath sounds: Normal breath sounds.  Abdominal:     General: There is no distension.     Palpations: Abdomen is soft.     Tenderness: There is no abdominal tenderness. There is left CVA tenderness. There is no right CVA tenderness.  Musculoskeletal:        General: Normal range of motion.     Cervical back: Normal range of motion.  Skin:    General: Skin is warm and dry.  Neurological:     Mental Status: He is alert and oriented to person, place, and time.  Psychiatric:        Behavior: Behavior normal.      UC Treatments / Results  Labs (all labs ordered are listed, but only abnormal results are displayed) Labs Reviewed  COMPLETE METABOLIC PANEL WITH GFR  POCT CBC W AUTO DIFF ([facility])    EKG   Radiology CT Renal Stone Study  Result Date: 02/16/2020 CLINICAL DATA:  Left flank pain with nausea and vomiting EXAM: CT ABDOMEN AND PELVIS WITHOUT CONTRAST TECHNIQUE: Multidetector CT imaging of the abdomen and pelvis was performed following the standard protocol without oral or IV contrast. COMPARISON:  None. FINDINGS: Lower chest: There is slight scarring in the anterior left base. Lung bases otherwise are clear. Hepatobiliary: There is hepatic steatosis. No focal liver lesions are appreciable on this noncontrast enhanced study. The gallbladder wall is not appreciably thickened. There is no biliary duct dilatation. Pancreas: There is no pancreatic mass or inflammatory focus. Spleen: No splenic lesions are evident. Adrenals/Urinary Tract: Adrenals bilaterally appear normal. There is mild perinephric stranding on the left. There is a cyst arising from the anterior upper pole of the right kidney measuring 1.5 x 1.4 cm. There is a cyst in the posterior upper to mid right kidney measuring 1.7 x 1.3 cm. There are calcifications along the periphery of this cyst measuring up to 5 mm. There is a probable complex cyst arising from the medial  upper pole of the left kidney measuring 2.0 x 1.4 cm. A cyst arising from the posterior upper to mid left kidney measures 4.0 x 2.9 cm. There is no appreciable hydronephrosis on either side. There are scattered 1-2 mm calculi throughout the right kidney. There are scattered 1-3 mm calculi throughout  the left kidney. There is subtle soft tissue stranding along the course of the left ureter. No ureteral calculi evident on either side. There is a 3 mm calculus in the posterior aspect of the urinary bladder, suggesting recent calculus passage. Stomach/Bowel: There is no appreciable bowel wall or mesenteric thickening. There is no evident bowel obstruction. The terminal ileum appears normal. There is no evident free air or portal venous air. Vascular/Lymphatic: There is no abdominal aortic aneurysm. No vascular lesions are evident on this noncontrast enhanced study. There is no adenopathy by size criteria in the abdomen or pelvis. Reproductive: Prostate and seminal vesicles are normal in size and contour. Other: No periappendiceal region inflammation. Appendix not appreciable. No abscess or ascites evident in the abdomen or pelvis. Musculoskeletal: No blastic or lytic bone lesions. No intramuscular or abdominal wall lesions are evident. IMPRESSION: 1. 3 mm calculus in the posterior aspect of the bladder midline. Suspect recent calculus passage. Subtle soft tissue stranding along the course of the left ureter suggests that calculus passage was from the left side. Soft tissue stranding in the perinephric fascia on the left suggests recent calculus causing a degree of obstruction on the left. There is currently no appreciable hydronephrosis. 2. Cysts in each kidney. There is a lesion in the anterior upper left kidney measuring 2.0 x 1.4 cm that cannot be classified as a simple cyst. Suspect complex cyst. Given this circumstance, further assessment advised. Further evaluation with pre and post contrast MRI or CT should be  considered. MRI is preferred in younger patients (due to lack of ionizing radiation) and for evaluating calcified lesion(s). 3.  Nonobstructing calculi in each kidney. 4. No evident bowel obstruction. No abscess in the abdomen or pelvis. No periappendiceal region inflammatory change. 5.  Hepatic steatosis. These results will be called to the ordering clinician or representative by the Radiologist Assistant, and communication documented in the PACS or [employer]. Electronically Signed   By: Bretta Bang III M.D.   On: 02/16/2020 10:03    Procedures Procedures (including critical care time)  Medications Ordered in UC Medications - No data to display  Initial Impression / Assessment and Plan / UC Course  I have reviewed the triage vital signs and the nursing notes.  Pertinent labs & imaging results that were available during my care of the patient were reviewed by me and considered in my medical decision making (see chart for details).     Discussed imaging with pt Continue medication prescribed by his PCP until f/u with urology Discussed renal cyst and need for f/u CT/MRI Discussed symptoms that warrant emergent care in the ED. AVS given   Final Clinical Impressions(s) / UC Diagnoses   Final diagnoses:  Left flank pain  Renal stones  Hematuria, gross  Renal colic  Renal cyst, left  Hepatic steatosis     Discharge Instructions      You have stones in both kidneys as well as one in your bladder, which is likely the cause of your recent pain.   Because you have multiple recurrent stones, it is recommended you establish care with a urologist for further evaluation and treatment to help prevent stones in the future.   In the meantime, stay well hydrated so your urine is pale yellow to clear in color.    You were also found to have a complex cyst (mass) on your Left kidney. It is advised that you follow up with your family doctor or a urologist for further evaluation with  an MRI or CT soon to determine if additional testing such as a biopsy and removal is indicated to rule out cancer.   Call 911 or have someone drive you to the hospital if symptoms significantly worsening.     ED Prescriptions    None     PDMP not reviewed this encounter.   Lurene Shadowhelps, Quanisha Drewry O, [place] 02/16/20 1052

## 2020-02-17 LAB — COMPLETE METABOLIC PANEL WITH GFR
AG Ratio: 1.8 (calc) (ref 1.0–2.5)
ALT: 131 U/L — ABNORMAL HIGH (ref 9–46)
AST: 55 U/L — ABNORMAL HIGH (ref 10–40)
Albumin: 4.6 g/dL (ref 3.6–5.1)
Alkaline phosphatase (APISO): 62 U/L (ref 36–130)
BUN: 23 mg/dL (ref 7–25)
CO2: 27 mmol/L (ref 20–32)
Calcium: 10.4 mg/dL — ABNORMAL HIGH (ref 8.6–10.3)
Chloride: 100 mmol/L (ref 98–110)
Creat: 1.28 mg/dL (ref 0.60–1.35)
GFR, Est African American: 76 mL/min/{1.73_m2} (ref >=60)
GFR, Est Non African American: 65 mL/min/{1.73_m2} (ref >=60)
Globulin: 2.5 g/dL (calc) (ref 1.9–3.7)
Glucose, Bld: 99 mg/dL (ref 65–99)
Potassium: 4.4 mmol/L (ref 3.5–5.3)
Sodium: 137 mmol/L (ref 135–146)
Total Bilirubin: 2.1 mg/dL — ABNORMAL HIGH (ref 0.2–1.2)
Total Protein: 7.1 g/dL (ref 6.1–8.1)

## 2020-08-08 HISTORY — PX: KIDNEY SURGERY: SHX687

## 2020-08-13 ENCOUNTER — Emergency Department
Admission: EM | Admit: 2020-08-13 | Discharge: 2020-08-13 | Disposition: A | Discharge status: Home / Self Care | Payer: PRIVATE HEALTH INSURANCE | Source: Home / Self Care | Attending: Family Medicine | Admitting: Family Medicine

## 2020-08-13 ENCOUNTER — Encounter: Payer: Self-pay | Admitting: Emergency Medicine

## 2020-08-13 ENCOUNTER — Ambulatory Visit: Payer: Self-pay

## 2020-08-13 ENCOUNTER — Other Ambulatory Visit: Payer: Self-pay

## 2020-08-13 ENCOUNTER — Emergency Department (INDEPENDENT_AMBULATORY_CARE_PROVIDER_SITE_OTHER): Payer: PRIVATE HEALTH INSURANCE

## 2020-08-13 DIAGNOSIS — R0602 Shortness of breath: Secondary | ICD-10-CM | POA: Diagnosis not present

## 2020-08-13 DIAGNOSIS — R059 Cough, unspecified: Secondary | ICD-10-CM

## 2020-08-13 HISTORY — DX: Anxiety disorder, unspecified: F41.9

## 2020-08-13 HISTORY — DX: Gout, unspecified: M10.9

## 2020-08-13 MED ORDER — BENZONATATE 200 MG PO CAPS: 200.0000 mg | ORAL_CAPSULE | Freq: Two times a day (BID) | ORAL | 0 refills | Status: AC | PRN

## 2020-08-13 NOTE — ED Triage Notes (Signed)
Patient here days 3 of cough, pressure in chest with cough and some shortness of breath; today very fatigued. Did have abdominal surgery 5 days ago and his surgeon via telephone suggested he come in for evaluation. Had covid 12/21; has had covid vaccinations x 3.

## 2020-08-13 NOTE — ED Triage Notes (Signed)
Patient here for cough with pressure in chest and some shortness of breath past 3 days; had abdominal surgery days ago; PCP per tele visit suggested evaluation [facility]. Has had all covid vaccinations and did have covid in 12/21.

## 2020-08-13 NOTE — ED Provider Notes (Addendum)
[name]    CSN: [id number] Arrival date & time: 08/13/20  1654      History   Chief Complaint Chief Complaint  Patient presents with  . Shortness of Breath    HPI Matthew George is a 50 y.o. male.   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 He has history of kidney stones. He had a benign tumor removed. He called his surgeon today to say that he has developed a cough and is seen shortness of breath. Pain with deep breath. No fever. No sputum production. All of the incisions appear to be healing well. No abdominal pain. Appetite is good. He is taking very little pain medication.  Past Medical History:  Diagnosis Date  . Anxiety   . Gout   . Hypertension     Patient Active Problem List   Diagnosis Date Noted  . Essential hypertension 11/01/2013  . Morbid obesity (HCC) 11/01/2013  . Gout 11/01/2013    Past Surgical History:  Procedure Laterality Date  . ANTERIOR CRUCIATE LIGAMENT REPAIR    . APPENDECTOMY    . KIDNEY SURGERY Left 08/08/2020       Home Medications    Prior to Admission medications   Medication Sig Start Date End Date Taking? Authorizing Provider  benzonatate (TESSALON) 200 MG capsule Take 1 capsule (200 mg total) by mouth 2 (two) times daily as needed for cough. 08/13/20  Yes Eustace Moore, MD  oxyCODONE (ROXICODONE INTENSOL) 20 MG/ML concentrated solution Take by mouth every 4 (four) hours as needed for severe pain.   Yes [provider]  allopurinol (ZYLOPRIM) 300 MG tablet Take 300 mg by mouth daily. 12/23/19   [provider]  clobetasol ointment (TEMOVATE) 0.05 % Apply topically.    [provider]  colchicine 0.6 MG tablet Take 2 tabs (1.2mg ) followed by 1 tab one hour later. May take 1 tab the next 2 days. 03/17/19   Elvina Sidle, MD  FLUoxetine (PROZAC) 10 MG tablet Take by mouth. 10/19/18 10/19/19  [provider]    Family History Family  History  Problem Relation Age of Onset  . Hyperlipidemia Mother   . Cancer Father   . Heart attack Maternal Grandfather   . Hyperlipidemia Other     Social History Social History   Tobacco Use  . Smoking status: Never Smoker  . Smokeless tobacco: Never Used  Vaping Use  . Vaping Use: Never used  Substance Use Topics  . Alcohol use: Yes    Alcohol/week: 3.0 standard drinks    Types: 3 Glasses of wine per week     Allergies   Iodine   Review of Systems Review of Systems See HPI  Physical Exam Triage Vital Signs ED Triage Vitals  Enc Vitals Group     BP 08/13/20 1723 120/74     Pulse Rate 08/13/20 1723 78     Resp 08/13/20 1723 18     Temp 08/13/20 1723 99 F (37.2 C)     Temp Source 08/13/20 1723 Oral     SpO2 08/13/20 1723 95 %     Weight 08/13/20 1724 242 lb 8.1 oz (110 kg)     Height 08/13/20 1724 5' 7.75" (1.721 m)     Head Circumference --      Peak Flow --      Pain Score 08/13/20 1724 1     Pain Loc --      Pain Edu? --  Excl. in GC? --    No data found.  Updated Vital Signs BP 120/74 (BP Location: Right Arm)   Pulse 78   Temp 99 F (37.2 C) (Oral)   Resp 18   Ht 5' 7.75" (1.721 m)   Wt 110 kg   SpO2 95%   BMI 37.15 kg/m      Physical Exam Vitals reviewed.  Constitutional:      General: He is not in acute distress.    Appearance: He is well-developed and well-nourished.     Comments: Appears uncomfortable. Harsh cough. Mildly dyspneic  HENT:     Head: Normocephalic and atraumatic.     Nose:     Comments: Mask is in place    Mouth/Throat:     Mouth: Oropharynx is clear and moist.  Eyes:     Conjunctiva/sclera: Conjunctivae normal.     Pupils: Pupils are equal, round, and reactive to light.  Cardiovascular:     Rate and Rhythm: Normal rate and regular rhythm.     Heart sounds: Normal heart sounds.  Pulmonary:     Effort: Pulmonary effort is normal. No respiratory distress.     Breath sounds: No decreased breath sounds.   Abdominal:     General: There is no distension.     Palpations: Abdomen is soft.     Comments: Multiple incisions are all healing well with staples intact  Musculoskeletal:        General: No edema. Normal range of motion.     Cervical back: Normal range of motion.  Skin:    General: Skin is warm and dry.  Neurological:     Mental Status: He is alert.  Psychiatric:        Behavior: Behavior normal.      UC Treatments / Results  Labs (all labs ordered are listed, but only abnormal results are displayed) Labs Reviewed - No data to display  EKG   Radiology DG Chest 2 View  Result Date: 08/13/2020 CLINICAL DATA:  Cough and shortness of breath x3 days. EXAM: CHEST - 2 VIEW COMPARISON:  None. FINDINGS: The heart size and mediastinal contours are within normal limits. No focal consolidation. No pleural effusion. No pneumothorax. The visualized skeletal structures are unremarkable. IMPRESSION: No active cardiopulmonary disease. Electronically Signed   By: Maudry Mayhew MD   On: 08/13/2020 18:49    Procedures Procedures (including critical care time)  Medications Ordered in UC Medications - No data to display  Initial Impression / Assessment and Plan / UC Course  I have reviewed the triage vital signs and the nursing notes.  Pertinent labs & imaging results that were available during my care of the patient were reviewed by me and considered in my medical decision making (see chart for details).     *Patient states his cough feels like a "bronchitis". There is no evidence of pneumonia on the chest x-ray or on physical examination He does not describe any shortness of breath or wheezing.  No underlying asthma.  No wheezing on exam He does not have any calf tenderness.  Leg swelling.  We discussed pulmonary embolus He understands that if he gets worse instead of better at any time he needs to go the emergency room For now we will treat with Tessalon and fluids Final Clinical  Impressions(s) / UC Diagnoses   Final diagnoses:  Cough     Discharge Instructions     Drink plenty of water Take the tessalon for cough  If  you get worse at any time instead of better, develop shortness of breath, chest pain, or increased difficulty breathing you must go to the emergency room    ED Prescriptions    Medication Sig Dispense Auth. Provider   benzonatate (TESSALON) 200 MG capsule Take 1 capsule (200 mg total) by mouth 2 (two) times daily as needed for cough. 20 capsule Eustace Moore, MD     PDMP not reviewed this encounter.   Eustace Moore, MD 08/13/20 Avanell Shackleton    Eustace Moore, MD 08/13/20 [phone]

## 2020-08-13 NOTE — Discharge Instructions (Addendum)
Drink plenty of water Take the tessalon for cough  If you get worse at any time instead of better, develop shortness of breath, chest pain, or increased difficulty breathing you must go to the emergency room

## 2020-11-06 ENCOUNTER — Emergency Department: Admit: 2020-11-06 | Discharge status: Absent without leave | Payer: Self-pay

## 2020-11-06 ENCOUNTER — Other Ambulatory Visit: Payer: Self-pay

## 2020-11-06 ENCOUNTER — Emergency Department
Admission: EM | Admit: 2020-11-06 | Discharge: 2020-11-06 | Disposition: A | Discharge status: Home / Self Care | Payer: PRIVATE HEALTH INSURANCE | Source: Home / Self Care

## 2020-11-06 DIAGNOSIS — K1120 Sialoadenitis, unspecified: Secondary | ICD-10-CM | POA: Diagnosis not present

## 2020-11-06 DIAGNOSIS — H9201 Otalgia, right ear: Secondary | ICD-10-CM | POA: Diagnosis not present

## 2020-11-06 DIAGNOSIS — H6121 Impacted cerumen, right ear: Secondary | ICD-10-CM

## 2020-11-06 MED ORDER — PREDNISONE 20 MG PO TABS: ORAL_TABLET | ORAL | 0 refills | Status: AC

## 2020-11-06 MED ORDER — AMOXICILLIN-POT CLAVULANATE 875-125 MG PO TABS: 1.0000 | ORAL_TABLET | Freq: Two times a day (BID) | ORAL | 0 refills | Status: AC

## 2020-11-06 NOTE — ED Triage Notes (Signed)
Pt presents to Urgent Care with c/o gingivitis surrounding R lower molar area x approx 1 week and R otalgia x 2 days ago.

## 2020-11-06 NOTE — ED Provider Notes (Signed)
[name]    CSN: [id number] Arrival date & time: 11/06/20  2025      History   Chief Complaint Chief Complaint  Patient presents with  . Otalgia    Right  . Gingivitis    Surrounding R lower molars    HPI Matthew George is a 50 y.o. male.   HPI 50 year old male presents with bilateral otitis (R>L) and dental pain around right lower molar for 1 week.  Patient reports previous history of right ear infection reports its been more than 4 years since last dental exam, patient cannot remember when last [facility] dental x-ray was performed.  Past Medical History:  Diagnosis Date  . Anxiety   . Gout   . Hypertension     Patient Active Problem List   Diagnosis Date Noted  . Essential hypertension 11/01/2013  . Morbid obesity (HCC) 11/01/2013  . Gout 11/01/2013    Past Surgical History:  Procedure Laterality Date  . ANTERIOR CRUCIATE LIGAMENT REPAIR    . APPENDECTOMY    . KIDNEY SURGERY Left 08/08/2020       Home Medications    Prior to Admission medications   Medication Sig Start Date End Date Taking? Authorizing Provider  amoxicillin-clavulanate (AUGMENTIN) 875-125 MG tablet Take 1 tablet by mouth every 12 (twelve) hours. 11/06/20  Yes Trevor Iha, FNP  predniSONE (DELTASONE) 20 MG tablet Take 3 tabs PO daily x 5 days. 11/06/20  Yes Trevor Iha, FNP  allopurinol (ZYLOPRIM) 300 MG tablet Take 300 mg by mouth daily. 12/23/19   [provider]  benzonatate (TESSALON) 200 MG capsule Take 1 capsule (200 mg total) by mouth 2 (two) times daily as needed for cough. 08/13/20   Eustace Moore, MD  clobetasol ointment (TEMOVATE) 0.05 % Apply topically.    [provider]  colchicine 0.6 MG tablet Take 2 tabs (1.2mg ) followed by 1 tab one hour later. May take 1 tab the next 2 days. 03/17/19   Elvina Sidle, MD  FLUoxetine (PROZAC) 10 MG tablet Take by mouth. 10/19/18 10/19/19  [provider]  oxyCODONE (ROXICODONE INTENSOL) 20 MG/ML  concentrated solution Take by mouth every 4 (four) hours as needed for severe pain.    [provider]    Family History Family History  Problem Relation Age of Onset  . Hyperlipidemia Mother   . Cancer Father   . Heart attack Maternal Grandfather   . Hyperlipidemia Other     Social History Social History   Tobacco Use  . Smoking status: Never Smoker  . Smokeless tobacco: Never Used  Vaping Use  . Vaping Use: Never used  Substance Use Topics  . Alcohol use: Yes    Alcohol/week: 3.0 standard drinks    Types: 3 Glasses of wine per week    Comment: weekly  . Drug use: Not Currently     Allergies   Iodine   Review of Systems Review of Systems  Constitutional: Negative.   HENT: Positive for ear pain and facial swelling.   Eyes: Negative.   Respiratory: Negative.   Cardiovascular: Negative.   Gastrointestinal: Negative.   Genitourinary: Negative.   Musculoskeletal: Negative.   Skin: Negative.   Neurological: Negative.      Physical Exam Triage Vital Signs ED Triage Vitals  Enc Vitals Group     BP 11/06/20 0841 130/87     Pulse Rate 11/06/20 0841 73     Resp 11/06/20 0841 20     Temp 11/06/20 0841 98.3 F (  36.8 C)     Temp Source 11/06/20 0841 Oral     SpO2 11/06/20 0841 95 %     Weight 11/06/20 0838 242 lb (109.8 kg)     Height 11/06/20 0838 5\' 6"  (1.676 m)     Head Circumference --      Peak Flow --      Pain Score 11/06/20 0837 8     Pain Loc --      Pain Edu? --      Excl. in GC? --    No data found.  Updated Vital Signs BP 130/87 (BP Location: Right Arm)   Pulse 73   Temp 98.3 F (36.8 C) (Oral)   Resp 20   Ht 5\' 6"  (1.676 m)   Wt 242 lb (109.8 kg)   SpO2 95%   BMI 39.06 kg/m     Physical Exam Vitals and nursing note reviewed.  Constitutional:      General: He is not in acute distress.    Appearance: Normal appearance. He is ill-appearing.  HENT:     Head: Normocephalic and atraumatic.     Comments: Face (right sided  superior to angle of mandible mandible): Moderate soft tissue swelling, erythema over right parotid gland, TTP    Right Ear: There is impacted cerumen.     Left Ear: Tympanic membrane and ear canal normal.     Ears:     Comments: Right EAC: Excessive cerumen/cerumen impaction unable to visualize left TM.  Post right EAC lavage: EAC erythematous, right TM cloudy, retracted.  Right pre-/post auricular lymphadenopathy noted, non-TTP.      Nose: Congestion present. No rhinorrhea.     Mouth/Throat:     Mouth: Mucous membranes are moist.     Pharynx: Oropharynx is clear.     Comments: Mouth (right-sided lower): Erythematous medial/lateral border of gingival mucosa surrounding premolar first and second molars. Eyes:     Extraocular Movements: Extraocular movements intact.     Conjunctiva/sclera: Conjunctivae normal.     Pupils: Pupils are equal, round, and reactive to light.  Cardiovascular:     Rate and Rhythm: Normal rate and regular rhythm.     Pulses: Normal pulses.     Heart sounds: Normal heart sounds.  Pulmonary:     Effort: Pulmonary effort is normal.     Breath sounds: Normal breath sounds.     Comments: No adventitious breath sounds noted Musculoskeletal:     Cervical back: Normal range of motion. No tenderness.  Lymphadenopathy:     Cervical: No cervical adenopathy.  Skin:    General: Skin is warm and dry.     Comments: Right sided jaw over right parotid gland: Erythematous  Neurological:     General: No focal deficit present.     Mental Status: He is alert and oriented to person, place, and time.  Psychiatric:        Mood and Affect: Mood normal.        Behavior: Behavior normal.      UC Treatments / Results  Labs (all labs ordered are listed, but only abnormal results are displayed) Labs Reviewed - No data to display  EKG   Radiology No results found.  Procedures Procedures (including critical care time)  Medications Ordered in UC Medications - No  data to display  Initial Impression / Assessment and Plan / UC Course  I have reviewed the triage vital signs and the nursing notes.  Pertinent labs & imaging results that were  available during my care of the patient were reviewed by me and considered in my medical decision making (see chart for details).     MDM: 1. Right parotitis, 2.  Right otalgia.  Patient discharged home, hemodynamically stable Final Clinical Impressions(s) / UC Diagnoses   Final diagnoses:  Otalgia of right ear  Parotitis  Impacted cerumen of right ear     Discharge Instructions     Advised/encouraged patient may use OTC Debrox 4 drops twice daily into right ear for 7 days, then return to clinic for right ear lavage.  Advised/instructed patient to take medication with food to completion.  Encouraged patient to increase daily water intake while taking this medication.  Advised patient to follow-up with his dentist for exam within the next week.    ED Prescriptions    Medication Sig Dispense Auth. Provider   amoxicillin-clavulanate (AUGMENTIN) 875-125 MG tablet Take 1 tablet by mouth every 12 (twelve) hours. 14 tablet Trevor Iha, FNP   predniSONE (DELTASONE) 20 MG tablet Take 3 tabs PO daily x 5 days. 15 tablet Trevor Iha, FNP     PDMP not reviewed this encounter.   Trevor Iha, FNP 11/06/20 [phone]

## 2020-11-06 NOTE — Discharge Instructions (Addendum)
Advised/encouraged patient may use OTC Debrox 4 drops twice daily into right ear for 7 days, then return to clinic for right ear lavage.  Advised/instructed patient to take medication with food to completion.  Encouraged patient to increase daily water intake while taking this medication.  Advised patient to follow-up with his dentist for exam within the next week.

## 2021-01-18 IMAGING — CT CT RENAL STONE PROTOCOL
2 of 4 series · 14 of 46 positions shown, 16 images · non-contrast
Comparison: None.

CLINICAL DATA: Left flank pain with nausea and vomiting

EXAM:
CT ABDOMEN AND PELVIS WITHOUT CONTRAST
TECHNIQUE: Multidetector CT imaging of the abdomen and pelvis was performed
following the standard protocol without oral or IV contrast.

[Series 2: axial st · axial · 0.98mm/px · z∈[-570,-74]mm · 11 of 119 slices shown, 13 images]
[im 10/119  soft-tissue]
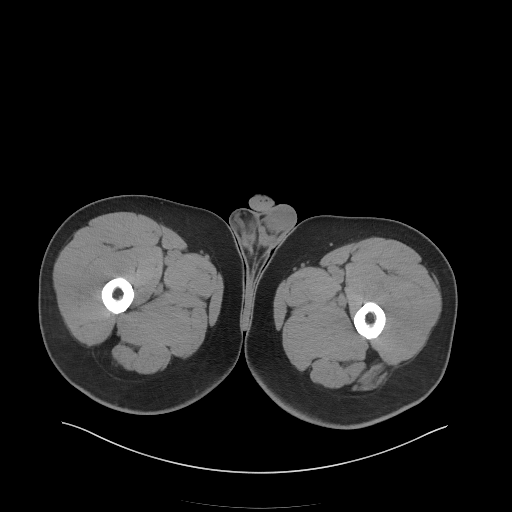
[im 10/119  bone]
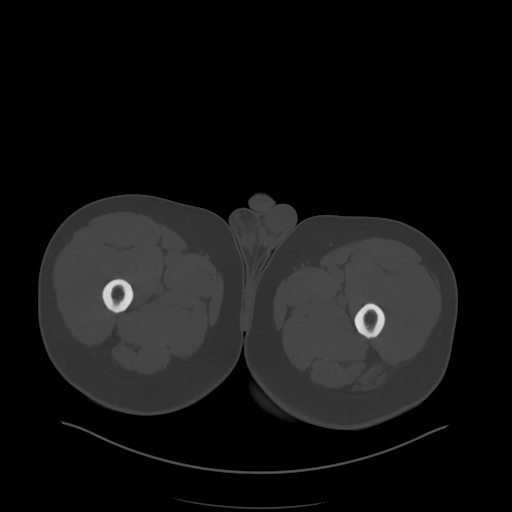
[im 20/119  soft-tissue]
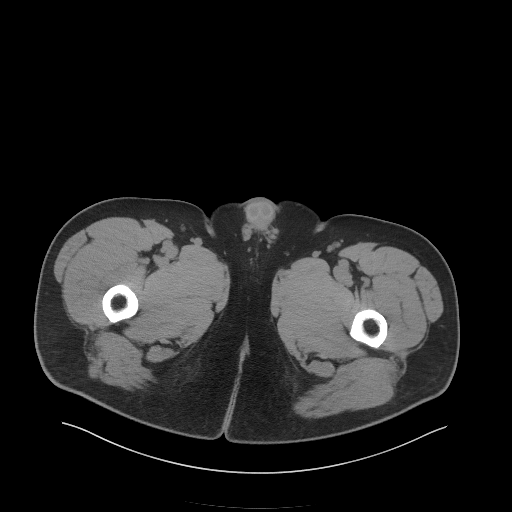
[im 30/119  soft-tissue]
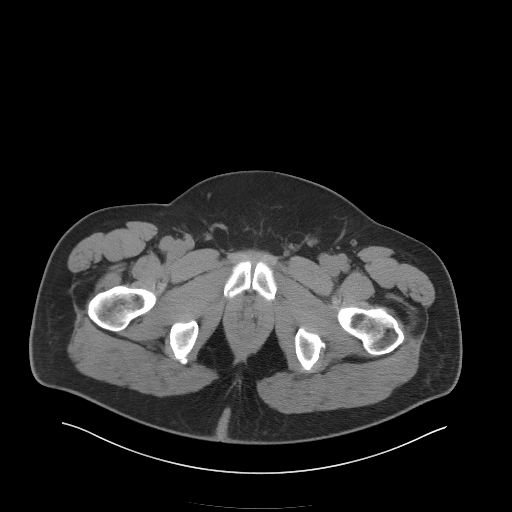
[im 40/119  soft-tissue]
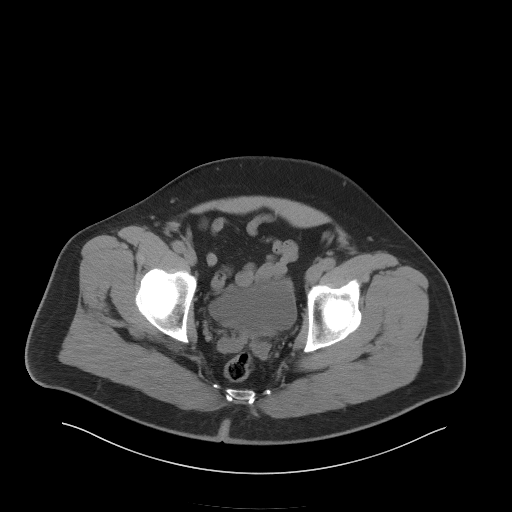
[im 50/119  soft-tissue]
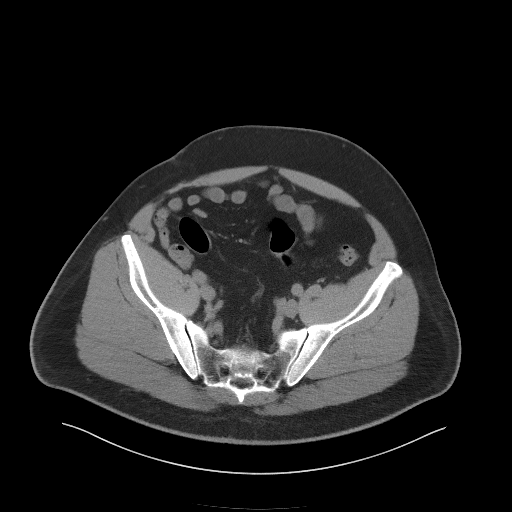
[im 60/119  soft-tissue]
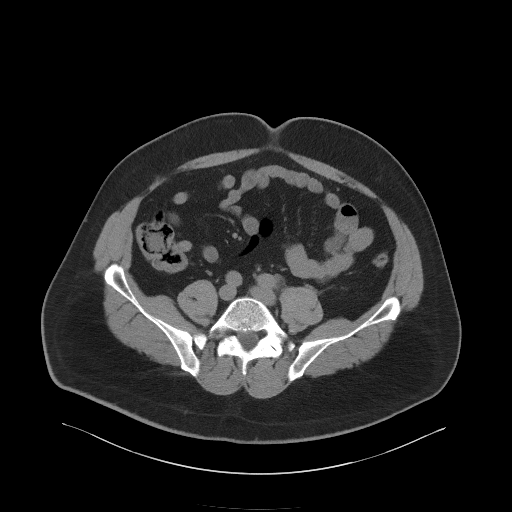
[im 69/119  soft-tissue]
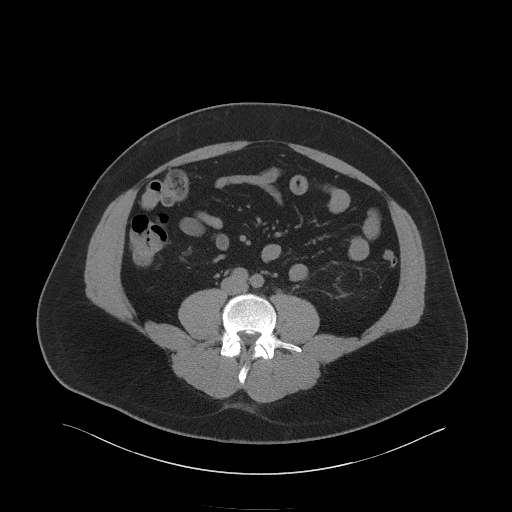
[im 79/119  soft-tissue]
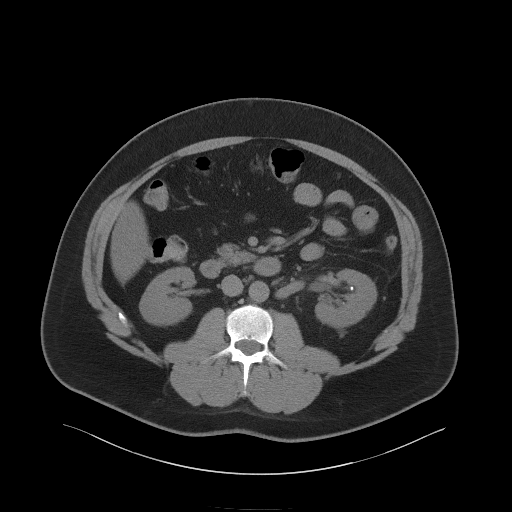
[im 89/119  soft-tissue]
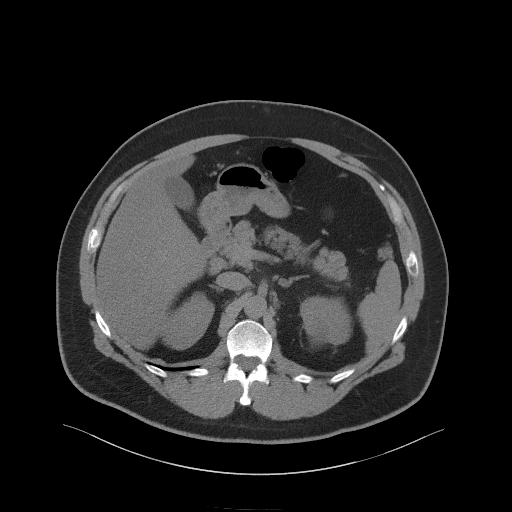
[im 89/119  bone]
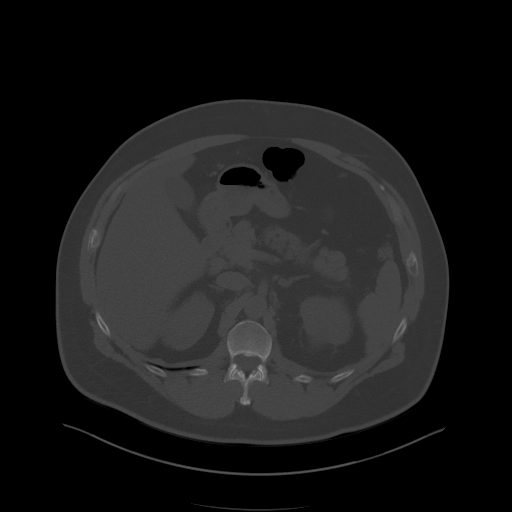
[im 99/119  soft-tissue]
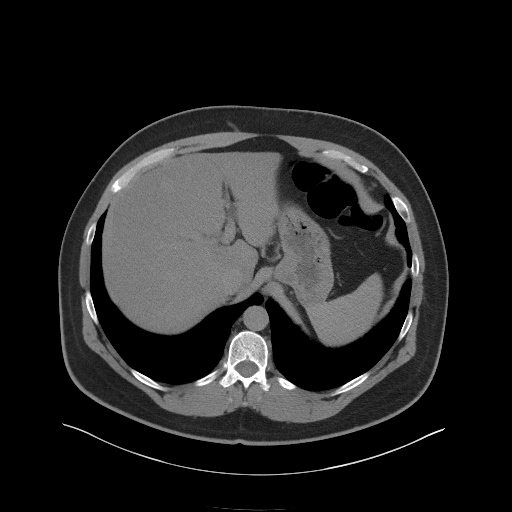
[im 109/119  soft-tissue]
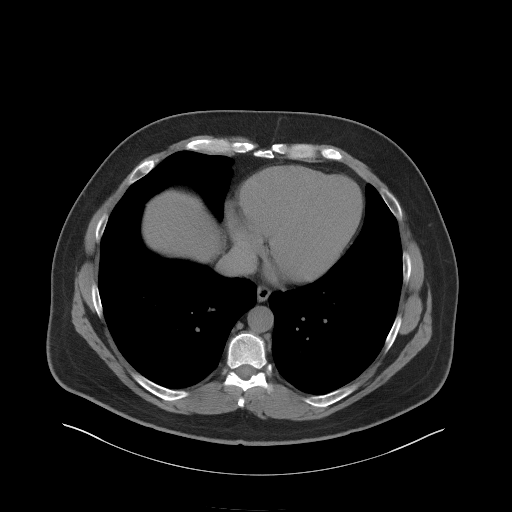

[Series 5: coronal st · coronal · 0.95mm/px · 3 of 117 slices shown]
[im 39/117  soft-tissue]
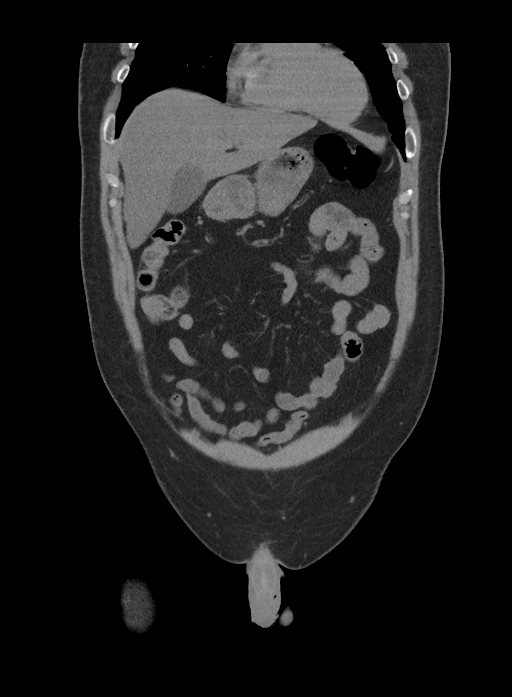
[im 52/117  soft-tissue]
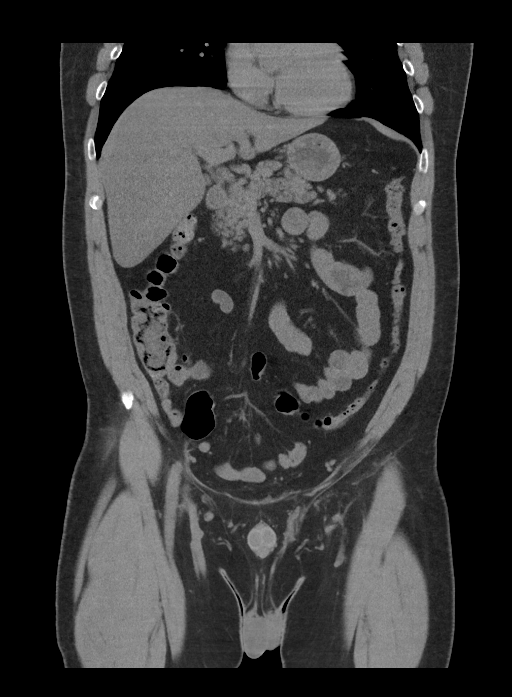
[im 65/117  soft-tissue]
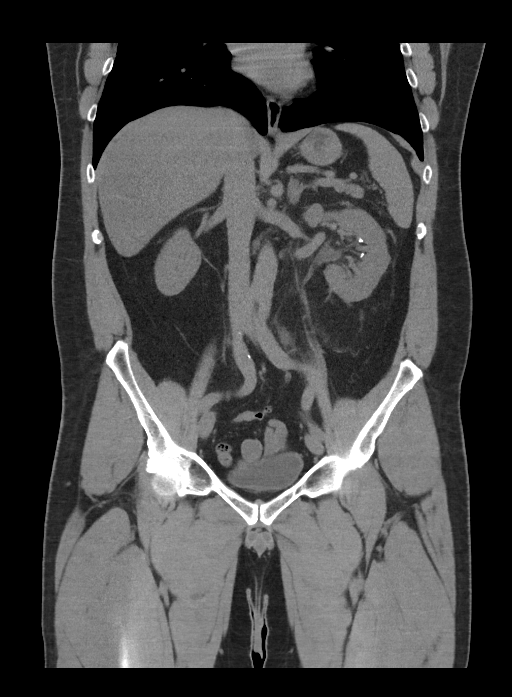

[14 of 46 positions shown; findings below may reference images not displayed]

FINDINGS: Lower chest: There is slight scarring in the anterior left base.
Lung bases otherwise are clear.

Hepatobiliary: There is hepatic steatosis. No focal liver lesions
are appreciable on this noncontrast enhanced study. The gallbladder
wall is not appreciably thickened. There is no biliary duct
dilatation.

Pancreas: There is no pancreatic mass or inflammatory focus.

Spleen: No splenic lesions are evident.

Adrenals/Urinary Tract: Adrenals bilaterally appear normal. There is
mild perinephric stranding on the left. There is a cyst arising from
the anterior upper pole of the right kidney measuring 1.5 x 1.4 cm.
There is a cyst in the posterior upper to mid right kidney measuring
1.7 x 1.3 cm. There are calcifications along the periphery of this
cyst measuring up to 5 mm. There is a probable complex cyst arising
from the medial upper pole of the left kidney measuring 2.0 x
cm. A cyst arising from the posterior upper to mid left kidney
measures 4.0 x 2.9 cm. There is no appreciable hydronephrosis on
either side. There are scattered 1-2 mm calculi throughout the right
kidney. There are scattered 1-3 mm calculi throughout the left
kidney. There is subtle soft tissue stranding along the course of
the left ureter. No ureteral calculi evident on either side. There
is a 3 mm calculus in the posterior aspect of the urinary bladder,
suggesting recent calculus passage.

Stomach/Bowel: There is no appreciable bowel wall or mesenteric
thickening. There is no evident bowel obstruction. The terminal
ileum appears normal. There is no evident free air or portal venous
air.

Vascular/Lymphatic: There is no abdominal aortic aneurysm. No
vascular lesions are evident on this noncontrast enhanced study.
There is no adenopathy by size criteria in the abdomen or pelvis.

Reproductive: Prostate and seminal vesicles are normal in size and
contour.

Other: No periappendiceal region inflammation. Appendix not
appreciable. No abscess or ascites evident in the abdomen or pelvis.

Musculoskeletal: No blastic or lytic bone lesions. No intramuscular
or abdominal wall lesions are evident.
IMPRESSION: 1. 3 mm calculus in the posterior aspect of the bladder midline.
Suspect recent calculus passage. Subtle soft tissue stranding along
the course of the left ureter suggests that calculus passage was
from the left side. Soft tissue stranding in the perinephric fascia
on the left suggests recent calculus causing a degree of obstruction
on the left. There is currently no appreciable hydronephrosis.

2. Cysts in each kidney. There is a lesion in the anterior upper
left kidney measuring 2.0 x 1.4 cm that cannot be classified as a
simple cyst. Suspect complex cyst. Given this circumstance, further
assessment advised. Further evaluation with pre and post contrast
MRI or CT should be considered. MRI is preferred in younger patients
(due to lack of ionizing radiation) and for evaluating calcified
lesion(s).

3.  Nonobstructing calculi in each kidney.

4. No evident bowel obstruction. No abscess in the abdomen or
pelvis. No periappendiceal region inflammatory change.

5.  Hepatic steatosis.

These results will be called to the ordering clinician or
representative by the Radiologist Assistant, and communication
documented in the PACS or [REDACTED].

## 2021-07-16 IMAGING — DX DG CHEST 2V
2 series · 2 of 2 positions shown · non-contrast
Comparison: None.

CLINICAL DATA: Cough and shortness of breath x3 days.

EXAM:
CHEST - 2 VIEW

[chest pa]
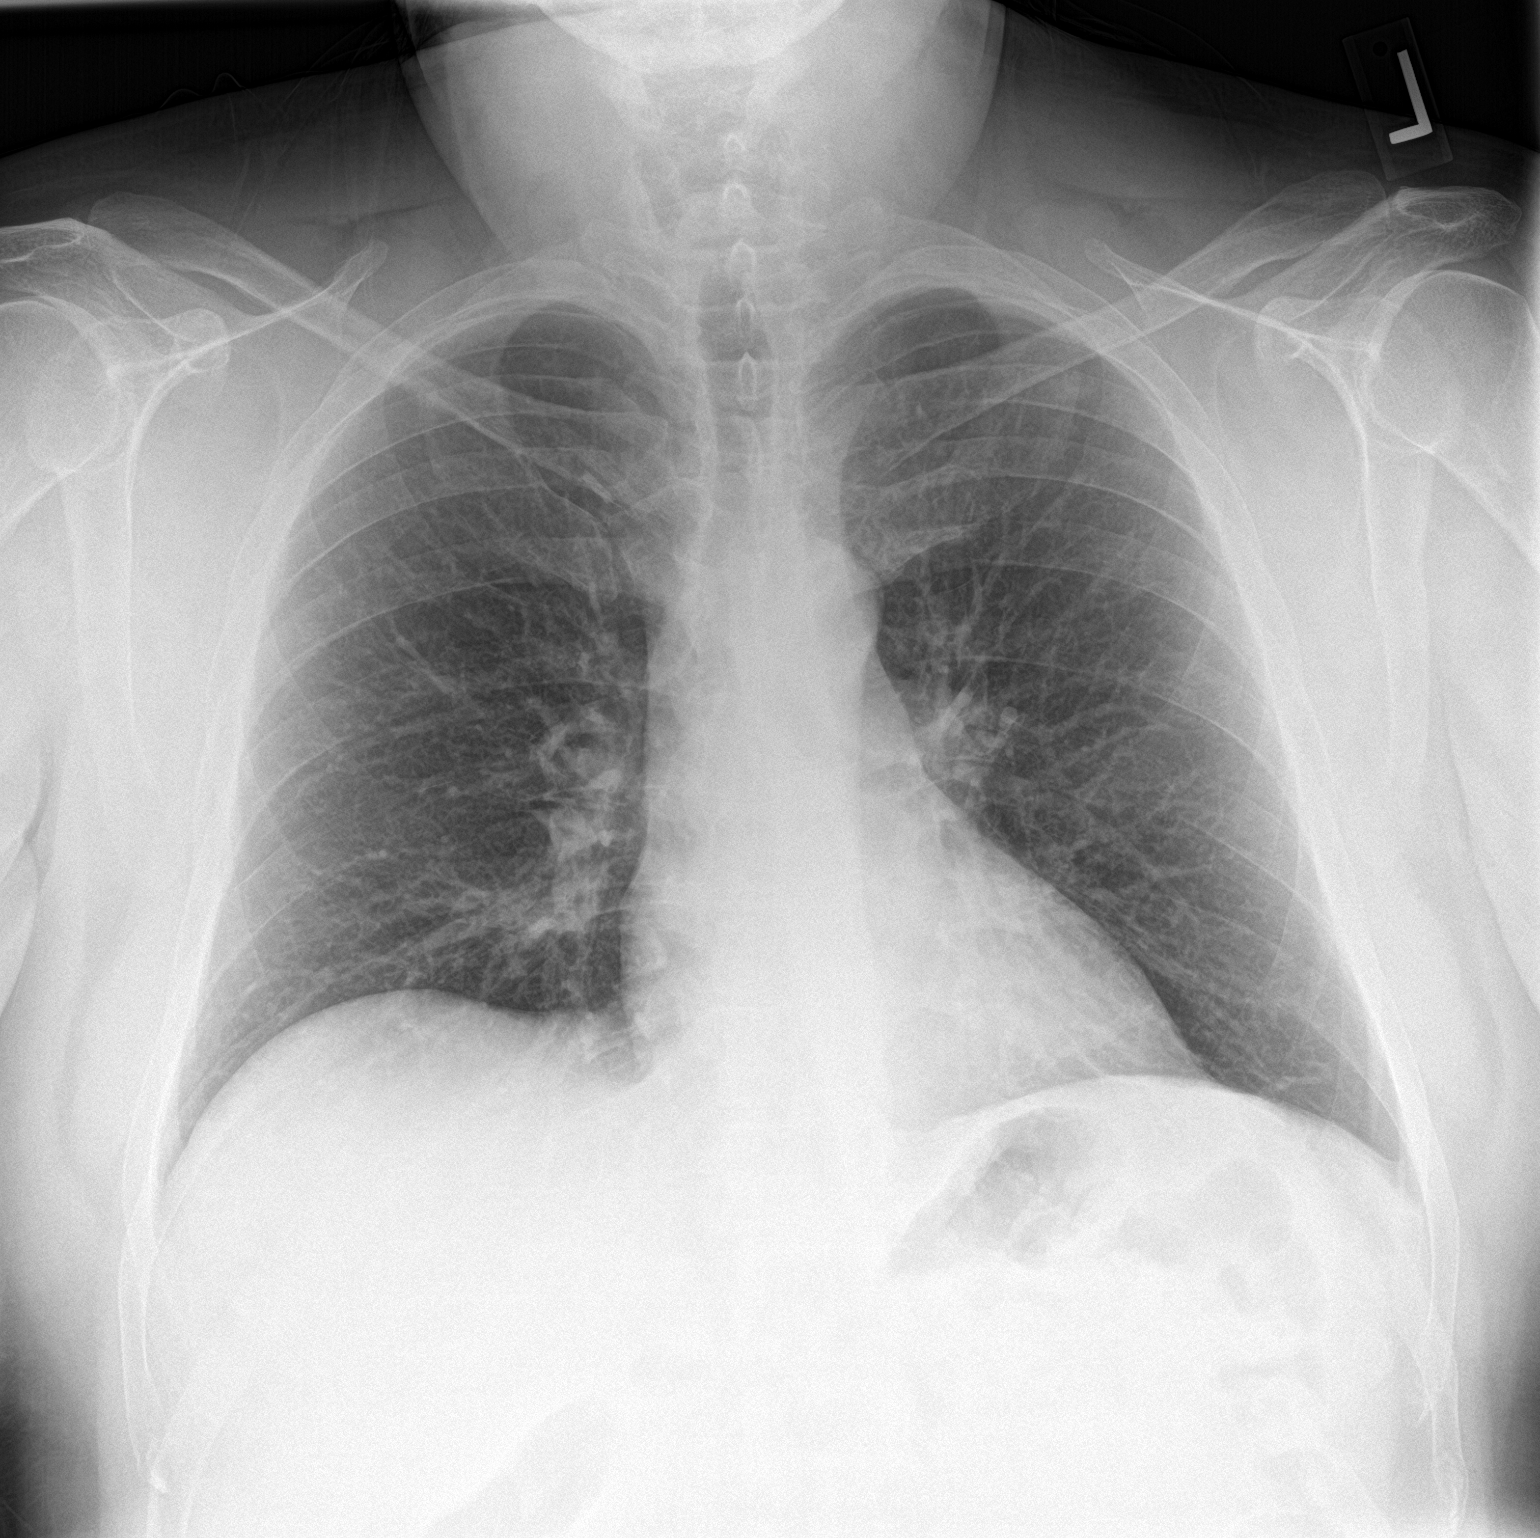

[chest lat]
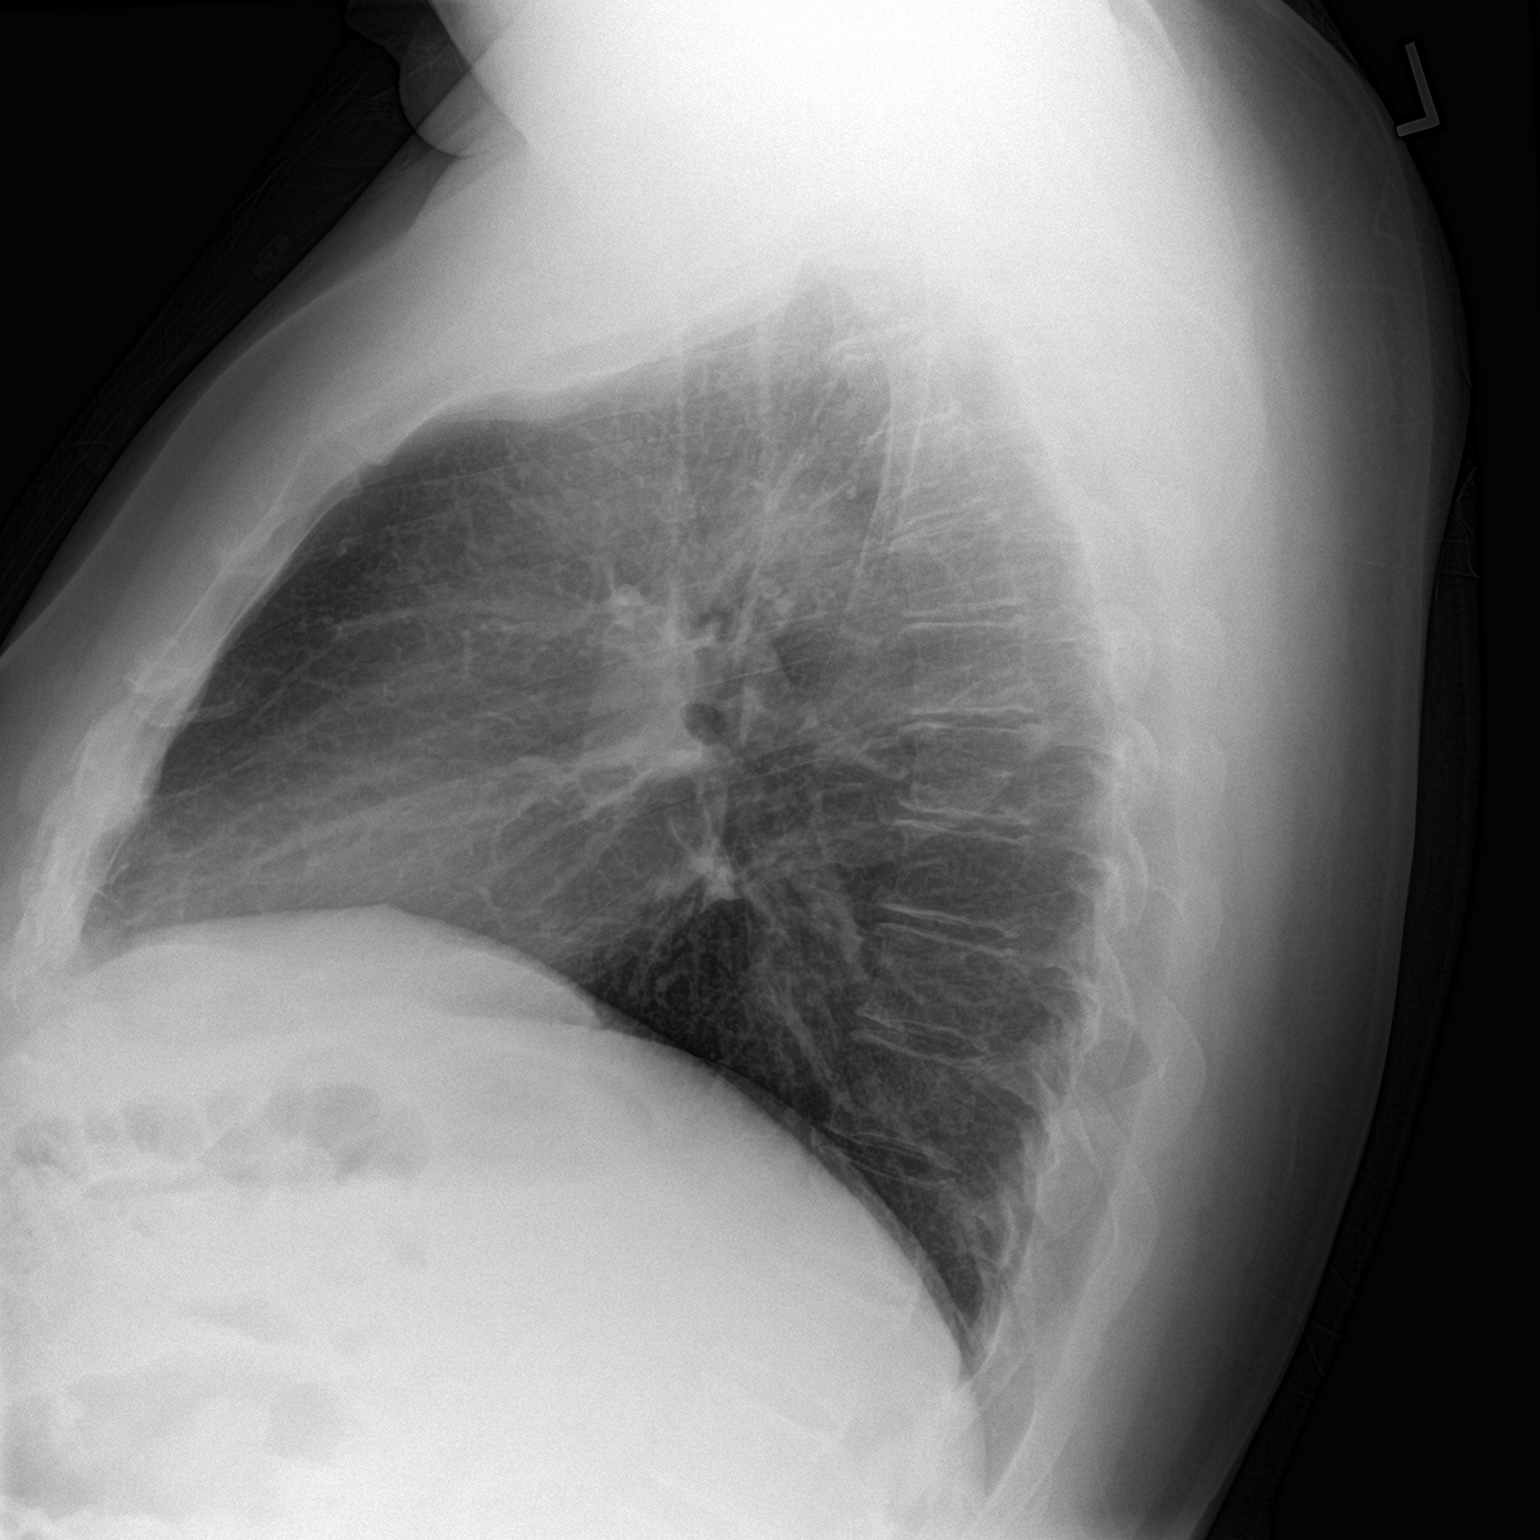

[2 of 2 positions shown; findings below may reference images not displayed]

FINDINGS: The heart size and mediastinal contours are within normal limits. No
focal consolidation. No pleural effusion. No pneumothorax. The
visualized skeletal structures are unremarkable.
IMPRESSION: No active cardiopulmonary disease.
